# Patient Record
Sex: Female | Born: 1995 | Hispanic: No | Marital: Single | Smoking: Never smoker
Health system: Southern US, Community
[De-identification: ages and names within clinical notes are randomized; demographics above are authoritative.]

## PROBLEM LIST (undated history)

## (undated) ENCOUNTER — Emergency Department (HOSPITAL_COMMUNITY): Admission: EM | Payer: BLUE CROSS/BLUE SHIELD | Source: Home / Self Care

## (undated) DIAGNOSIS — F32A Depression, unspecified: Secondary | ICD-10-CM

## (undated) DIAGNOSIS — S62609A Fracture of unspecified phalanx of unspecified finger, initial encounter for closed fracture: Secondary | ICD-10-CM

## (undated) DIAGNOSIS — R51 Headache: Secondary | ICD-10-CM

## (undated) DIAGNOSIS — F419 Anxiety disorder, unspecified: Secondary | ICD-10-CM

## (undated) DIAGNOSIS — F429 Obsessive-compulsive disorder, unspecified: Secondary | ICD-10-CM

## (undated) DIAGNOSIS — F4310 Post-traumatic stress disorder, unspecified: Secondary | ICD-10-CM

## (undated) HISTORY — DX: Post-traumatic stress disorder, unspecified: F43.10

## (undated) HISTORY — DX: Anxiety disorder, unspecified: F41.9

## (undated) HISTORY — DX: Obsessive-compulsive disorder, unspecified: F42.9

## (undated) HISTORY — PX: FINGER FRACTURE SURGERY: SHX638

## (undated) HISTORY — DX: Headache: R51

## (undated) HISTORY — DX: Depression, unspecified: F32.A

---
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
## 2017-01-19 ENCOUNTER — Encounter: Payer: Self-pay | Admitting: Women's Health

## 2017-01-19 ENCOUNTER — Ambulatory Visit (INDEPENDENT_AMBULATORY_CARE_PROVIDER_SITE_OTHER): Payer: BLUE CROSS/BLUE SHIELD | Admitting: Women's Health

## 2017-01-19 VITALS — BP 124/80 | Ht 65.0 in | Wt 116.0 lb

## 2017-01-19 DIAGNOSIS — Z01419 Encounter for gynecological examination (general) (routine) without abnormal findings: Secondary | ICD-10-CM

## 2017-01-19 DIAGNOSIS — Z30011 Encounter for initial prescription of contraceptive pills: Secondary | ICD-10-CM | POA: Diagnosis not present

## 2017-01-19 LAB — CBC WITH DIFFERENTIAL/PLATELET
BASOS ABS: 49 {cells}/uL (ref 0–200)
BASOS PCT: 0.6 %
EOS PCT: 1.2 %
Eosinophils Absolute: 97 cells/uL (ref 15–500)
HEMATOCRIT: 36.7 % (ref 35.0–45.0)
HEMOGLOBIN: 12.1 g/dL (ref 11.7–15.5)
LYMPHS ABS: 2867 {cells}/uL (ref 850–3900)
MCH: 27.8 pg (ref 27.0–33.0)
MCHC: 33 g/dL (ref 32.0–36.0)
MCV: 84.2 fL (ref 80.0–100.0)
MPV: 9 fL (ref 7.5–12.5)
Monocytes Relative: 5.3 %
NEUTROS ABS: 4658 {cells}/uL (ref 1500–7800)
Neutrophils Relative %: 57.5 %
Platelets: 343 10*3/uL (ref 140–400)
RBC: 4.36 10*6/uL (ref 3.80–5.10)
RDW: 11.8 % (ref 11.0–15.0)
Total Lymphocyte: 35.4 %
WBC mixed population: 429 cells/uL (ref 200–950)
WBC: 8.1 10*3/uL (ref 3.8–10.8)

## 2017-01-19 MED ORDER — YAZ 3-0.02 MG PO TABS: 1.0000 | ORAL_TABLET | Freq: Every day | ORAL | 4 refills | Status: DC

## 2017-01-19 NOTE — Patient Instructions (Signed)

## 2017-01-19 NOTE — Progress Notes (Signed)
Pamela Guerra 07/28/1995 [id number]    History:    Presents for annual exam.  Monthly cycles on generic Yaz, pharmacy changed to different Yaz generic which she has taken for the past month, noticed increased acne outbreaks. Same partner, negative STD screen. Completed Gardasil series. History of migraines which rarely occur about once every 1-2 months, does not require medication.  Past medical history, past surgical history, family history and social history were all reviewed and documented in the EPIC chart. Lives at home, [employer], psychology major, interested in behavior analysis & autism.   ROS:  A ROS was performed and pertinent positives and negatives are included.  Exam:  Vitals:   01/19/17 1417  BP: 124/80  Weight: 116 lb (52.6 kg)  Height: 5\' 5"  (1.651 m)   Body mass index is 19.3 kg/m.   General appearance:  Normal Thyroid:  Symmetrical, normal in size, without palpable masses or nodularity. Respiratory  Auscultation:  Clear without wheezing or rhonchi Cardiovascular  Auscultation:  Regular rate, without rubs, murmurs or gallops  Edema/varicosities:  Not grossly evident Abdominal  Soft,nontender, without masses, guarding or rebound.  Liver/spleen:  No organomegaly noted  Hernia:  None appreciated  Skin  Inspection:  Grossly normal   Breasts: Examined lying and sitting.     Right: Without masses, retractions, discharge or axillary adenopathy.     Left: Without masses, retractions, discharge or axillary adenopathy. Gentitourinary   Inguinal/mons:  Normal without inguinal adenopathy  External genitalia:  Normal  BUS/Urethra/Skene's glands:  Normal  Vagina:  Normal  Cervix:  Normal  Uterus:  antverted, normal in size, shape and contour.  Midline and mobile  Adnexa/parametria:     Rt: Without masses or tenderness.   Lt: Without masses or tenderness.  Anus and perineum: Normal   Assessment/Plan:  21 y.o.SWF G0 presents for annual exam with increasing  acne with different generic Yaz.  Monthly cycles on  Yaz Acne Migraines-rare  Plan: Yaz prescription sent to pharmacy to dispense as written. Reviewed proper use and slight risk for blood clots and stroke. Instructed to call if continued problems. Safe dating, driving, and campus safety reviewed. Encouraged SBE's, exercise, calcium rich diet, and multivitamin. CBC.   Harrington Challengerancy J Samael Blades [facility] State HospitalWHNP, 2:48 PM 01/19/2017

## 2017-02-01 ENCOUNTER — Ambulatory Visit (HOSPITAL_COMMUNITY)
Admission: EM | Admit: 2017-02-01 | Discharge: 2017-02-01 | Disposition: A | Discharge status: Home / Self Care | Payer: BLUE CROSS/BLUE SHIELD | Attending: Family Medicine | Admitting: Family Medicine

## 2017-02-01 ENCOUNTER — Encounter (HOSPITAL_COMMUNITY): Payer: Self-pay | Admitting: *Deleted

## 2017-02-01 ENCOUNTER — Other Ambulatory Visit: Payer: Self-pay

## 2017-02-01 DIAGNOSIS — R300 Dysuria: Secondary | ICD-10-CM | POA: Diagnosis not present

## 2017-02-01 DIAGNOSIS — M545 Low back pain: Secondary | ICD-10-CM | POA: Insufficient documentation

## 2017-02-01 DIAGNOSIS — N390 Urinary tract infection, site not specified: Secondary | ICD-10-CM | POA: Diagnosis not present

## 2017-02-01 LAB — POCT URINALYSIS DIP (DEVICE)
BILIRUBIN URINE: NEGATIVE
GLUCOSE, UA: 100 mg/dL — AB
Ketones, ur: NEGATIVE mg/dL
NITRITE: POSITIVE — AB
Protein, ur: NEGATIVE mg/dL
UROBILINOGEN UA: 1 mg/dL (ref 0.0–1.0)
pH: 6 (ref 5.0–8.0)

## 2017-02-01 MED ORDER — FLUORESCEIN SODIUM 0.6 MG OP STRP
ORAL_STRIP | OPHTHALMIC | Status: AC
  Filled 2017-02-01: qty 1

## 2017-02-01 MED ORDER — TETRACAINE HCL 0.5 % OP SOLN
OPHTHALMIC | Status: AC
  Filled 2017-02-01: qty 4

## 2017-02-01 MED ORDER — CEPHALEXIN 500 MG PO CAPS: 500.0000 mg | ORAL_CAPSULE | Freq: Four times a day (QID) | ORAL | 0 refills | Status: DC

## 2017-02-01 NOTE — ED Provider Notes (Signed)
[facility]   [id number] 02/01/17 Arrival Time: 1116   SUBJECTIVE:  Pamela Guerra is a 21 y.o. female who presents to the urgent care with 2 day history of burning urination, sensation like she has to urinate, pain when urinating, today she started having lower back pain,    Past Medical History:  Diagnosis Date  . Headache(784.0)   . Phalanx (hand) fracture 01/2011   right small middle phalanx fx.   Family History  Problem Relation Age of Onset  . Cancer Paternal Grandfather        esophagus  . Stroke Maternal Grandfather    Social History   Socioeconomic History  . Marital status: Single    Spouse name: Not on file  . Number of children: Not on file  . Years of education: Not on file  . Highest education level: Not on file  Social Needs  . Financial resource strain: Not on file  . Food insecurity - worry: Not on file  . Food insecurity - inability: Not on file  . Transportation needs - medical: Not on file  . Transportation needs - non-medical: Not on file  Occupational History  . Not on file  Tobacco Use  . Smoking status: Never Smoker  . Smokeless tobacco: Never Used  Substance and Sexual Activity  . Alcohol use: No  . Drug use: No  . Sexual activity: Yes    Birth control/protection: Condom  Other Topics Concern  . Not on file  Social History Narrative  . Not on file   Current Meds  Medication Sig  . YAZ 3-0.02 MG tablet Take 1 tablet by mouth daily.   Allergies  Allergen Reactions  . Other     Per pt she do not remember the name of the med she was prescribed for UTI.       ROS: As per HPI, remainder of ROS negative.   OBJECTIVE:   Vitals:   02/01/17 1148  BP: 113/74  Pulse: 92  Temp: 98.2 F (36.8 C)  TempSrc: Oral  SpO2: 98%     General appearance: alert; no distress Eyes: PERRL; EOMI; conjunctiva normal HENT: normocephalic; atraumatic; TMs normal, canal normal, external ears normal without trauma; nasal mucosa  normal; oral mucosa normal Neck: supple Abdomen: soft, non-tender; bowel sounds normal; no masses or organomegaly; no guarding or rebound tenderness Back: no CVA tenderness Extremities: no cyanosis or edema; symmetrical with no gross deformities Skin: warm and dry Neurologic: normal gait; grossly normal Psychological: alert and cooperative; normal mood and affect      Labs:  Results for orders placed or performed during the hospital encounter of 02/01/17  POCT urinalysis dip (device)  Result Value Ref Range   Glucose, UA 100 (A) NEGATIVE mg/dL   Bilirubin Urine NEGATIVE NEGATIVE   Ketones, ur NEGATIVE NEGATIVE mg/dL   Specific Gravity, Urine <=1.005 1.005 - 1.030   Hgb urine dipstick TRACE (A) NEGATIVE   pH 6.0 5.0 - 8.0   Protein, ur NEGATIVE NEGATIVE mg/dL   Urobilinogen, UA 1.0 0.0 - 1.0 mg/dL   Nitrite POSITIVE (A) NEGATIVE   Leukocytes, UA LARGE (A) NEGATIVE    Labs Reviewed  POCT URINALYSIS DIP (DEVICE) - Abnormal; Notable for the following components:      Result Value   Glucose, UA 100 (*)    Hgb urine dipstick TRACE (*)    Nitrite POSITIVE (*)    Leukocytes, UA LARGE (*)    All other components within normal limits  No results found.     ASSESSMENT & PLAN:  No diagnosis found.  No orders of the defined types were placed in this encounter.   Reviewed expectations re: course of current medical issues. Questions answered. Outlined signs and symptoms indicating need for more acute intervention. Patient verbalized understanding. After Visit Summary given.    Procedures:      Elvina SidleLauenstein, Josanna Hefel, MD 02/01/17 1231

## 2017-02-01 NOTE — ED Triage Notes (Signed)
Per pt she things she have UTI, per pt she has been taking Azo but it did not help her, per pt burning urination, sensation like she has to urinate, pain when urinating, today she started having lower back pain,

## 2017-02-01 NOTE — Discharge Instructions (Signed)
The urine test done here strongly suggest that you do have a urinary infection which should resolve with the antibiotic prescribed.

## 2017-02-02 LAB — URINE CULTURE: Culture: <10000 — AB

## 2017-10-05 ENCOUNTER — Ambulatory Visit (INDEPENDENT_AMBULATORY_CARE_PROVIDER_SITE_OTHER): Payer: BLUE CROSS/BLUE SHIELD | Admitting: Women's Health

## 2017-10-05 ENCOUNTER — Encounter: Payer: Self-pay | Admitting: Women's Health

## 2017-10-05 VITALS — BP 116/72 | Ht 65.0 in | Wt 113.0 lb

## 2017-10-05 DIAGNOSIS — R634 Abnormal weight loss: Secondary | ICD-10-CM | POA: Diagnosis not present

## 2017-10-05 NOTE — Patient Instructions (Signed)
 High-Protein and High-Calorie Diet Eating high-protein and high-calorie foods can help you to gain weight, heal after an injury, and recover after an illness or surgery. What is my plan? The specific amount of daily protein and calories you need depends on:  Your body weight.  The reason this diet is recommended for you.  Generally, a high-protein, high-calorie diet involves:  Eating 250-500 extra calories each day.  Making sure that 10-35% of your daily calories come from protein.  Talk to your health care provider about how much protein and how many calories you need each day. Follow the diet as directed by your health care provider. What do I need to know about this diet?  Ask your health care provider if you should take a nutritional supplement.  Try to eat six small meals each day instead of three large meals.  Eat a balanced diet, including one food that is high in protein at each meal.  Keep nutritious snacks handy, such as nuts, trail mixes, dried fruit, and yogurt.  If you have kidney disease or diabetes, eating too much protein may put extra stress on your kidneys. Talk to your health care provider if you have either of those conditions. What are some high-protein foods? Grains Quinoa. Bulgur wheat. Vegetables Soybeans. Peas. Meats and Other Protein Sources Beef, pork, and poultry. Fish and seafood. Eggs. Tofu. Textured vegetable protein (TVP). Peanut butter. Nuts and seeds. Dried beans. Protein powders. Dairy Whole milk. Whole-milk yogurt. Powdered milk. Cheese. Cottage Cheese. Eggnog. Beverages High-protein supplement drinks. Soy milk. Other Protein bars. The items listed above may not be a complete list of recommended foods or beverages. Contact your dietitian for more options. What are some high-calorie foods? Grains Pasta. Quick breads. Muffins. Pancakes. Ready-to-eat cereal. Vegetables Vegetables cooked in oil or butter. Fried potatoes. Fruits Dried  fruit. Fruit leather. Canned fruit in syrup. Fruit juice. Avocados. Meats and Other Protein Sources Peanut butter. Nuts and seeds. Dairy Heavy cream. Whipped cream. Cream cheese. Sour cream. Ice cream. Custard. Pudding. Beverages Meal-replacement beverages. Nutrition shakes. Fruit juice. Sugar-sweetened soft drinks. Condiments Salad dressing. Mayonnaise. Alfredo sauce. Fruit preserves or jelly. Honey. Syrup. Sweets/Desserts Cake. Cookies. Pie. Pastries. Candy bars. Chocolate. Fats and Oils Butter or margarine. Oil. Gravy. Other Meal-replacement bars. The items listed above may not be a complete list of recommended foods or beverages. Contact your dietitian for more options. What are some tips for including high-protein and high-calorie foods in my diet?  Add whole milk, half-and-half, or heavy cream to cereal, pudding, soup, or hot cocoa.  Add whole milk to instant breakfast drinks.  Add peanut butter to oatmeal or smoothies.  Add powdered milk to baked goods, smoothies, or milkshakes.  Add powdered milk, cream, or butter to mashed potatoes.  Add cheese to cooked vegetables.  Make whole-milk yogurt parfaits. Top them with granola, fruit, or nuts.  Add cottage cheese to your fruit.  Add avocados, cheese, or both to sandwiches or salads.  Add meat, poultry, or seafood to rice, pasta, casseroles, salads, and soups.  Use mayonnaise when making egg salad, chicken salad, or tuna salad.  Use peanut butter as a topping for pretzels, celery, or crackers.  Add beans to casseroles, dips, and spreads.  Add pureed beans to sauces and soups.  Replace calorie-free drinks with calorie-containing drinks, such as milk and fruit juice. This information is not intended to replace advice given to you by your health care provider. Make sure you discuss any questions you have with your   health care provider. Document Released: 02/09/2005 Document Revised: 07/18/2015 Document Reviewed:  07/25/2013 Elsevier Interactive Patient Education  2018 Elsevier Inc.  

## 2017-10-05 NOTE — Progress Notes (Signed)
22 year old S[employer] G0 presents with complaint of inability to gain weight.  States she is too thin and although eating close to 2500 cal daily has been unable to gain weight.  States her parents are slightly overweight, looks like 1 of her aunts and has the same thin body build.  With review of diet - diet is high in proteins and low in carbohydrates, exercises several times weekly for 45 minutes to an hour.  Reports parents eat rice numerous times daily and she rarely eats rice.  Currently not in a relationship but has had a past boyfriend tell her she was too thin.  Weight today 113 BMI of 18.8.  Monthly cycle on Yaz, not sexually active, denies need for STD screen.  Denies feeling depressed but did become slightly tearful when asked.  Denies vaginal discharge, urinary symptoms, abdominal pain or fever.  Student [employer] doing well.  Exam: Slim but appears well.  Perceived low weight  Plan: Will check TSH and CMP.  Reviewed most likely genetic body build, continue to eat healthy foods , add in more carbohydrates to diet. Encouraged to talk to a counselor at student [facility] at [employer].  Reassured she was normal.

## 2017-10-06 LAB — COMPREHENSIVE METABOLIC PANEL
AG RATIO: 1.2 (calc) (ref 1.0–2.5)
ALT: 6 U/L (ref 6–29)
AST: 10 U/L (ref 10–30)
Albumin: 4 g/dL (ref 3.6–5.1)
Alkaline phosphatase (APISO): 49 U/L (ref 33–115)
BUN: 10 mg/dL (ref 7–25)
CO2: 26 mmol/L (ref 20–32)
Calcium: 9.5 mg/dL (ref 8.6–10.2)
Chloride: 102 mmol/L (ref 98–110)
Creat: 0.72 mg/dL (ref 0.50–1.10)
GLUCOSE: 97 mg/dL (ref 65–99)
Globulin: 3.3 g/dL (calc) (ref 1.9–3.7)
Potassium: 3.5 mmol/L (ref 3.5–5.3)
SODIUM: 136 mmol/L (ref 135–146)
TOTAL PROTEIN: 7.3 g/dL (ref 6.1–8.1)
Total Bilirubin: 0.3 mg/dL (ref 0.2–1.2)

## 2017-10-06 LAB — TSH: TSH: 0.65 mIU/L

## 2018-01-30 ENCOUNTER — Other Ambulatory Visit: Payer: Self-pay | Admitting: Women's Health

## 2018-01-30 DIAGNOSIS — Z30011 Encounter for initial prescription of contraceptive pills: Secondary | ICD-10-CM

## 2018-01-31 NOTE — Telephone Encounter (Signed)
annual exam on 02/01/18

## 2018-02-01 ENCOUNTER — Ambulatory Visit (INDEPENDENT_AMBULATORY_CARE_PROVIDER_SITE_OTHER): Payer: BLUE CROSS/BLUE SHIELD | Admitting: Women's Health

## 2018-02-01 ENCOUNTER — Encounter: Payer: Self-pay | Admitting: Women's Health

## 2018-02-01 VITALS — BP 110/82 | Ht 65.5 in | Wt 115.8 lb

## 2018-02-01 DIAGNOSIS — Z01419 Encounter for gynecological examination (general) (routine) without abnormal findings: Secondary | ICD-10-CM

## 2018-02-01 DIAGNOSIS — Z30011 Encounter for initial prescription of contraceptive pills: Secondary | ICD-10-CM

## 2018-02-01 LAB — CBC WITH DIFFERENTIAL/PLATELET
BASOS ABS: 59 {cells}/uL (ref 0–200)
BASOS PCT: 0.8 %
EOS ABS: 148 {cells}/uL (ref 15–500)
EOS PCT: 2 %
HEMATOCRIT: 37.5 % (ref 35.0–45.0)
Hemoglobin: 12.4 g/dL (ref 11.7–15.5)
LYMPHS ABS: 2597 {cells}/uL (ref 850–3900)
MCH: 27.6 pg (ref 27.0–33.0)
MCHC: 33.1 g/dL (ref 32.0–36.0)
MCV: 83.5 fL (ref 80.0–100.0)
MONOS PCT: 8.9 %
MPV: 9.2 fL (ref 7.5–12.5)
NEUTROS ABS: 3937 {cells}/uL (ref 1500–7800)
NEUTROS PCT: 53.2 %
Platelets: 429 10*3/uL — ABNORMAL HIGH (ref 140–400)
RBC: 4.49 10*6/uL (ref 3.80–5.10)
RDW: 11.9 % (ref 11.0–15.0)
Total Lymphocyte: 35.1 %
WBC mixed population: 659 cells/uL (ref 200–950)
WBC: 7.4 10*3/uL (ref 3.8–10.8)

## 2018-02-01 MED ORDER — YAZ 3-0.02 MG PO TABS: 1.0000 | ORAL_TABLET | Freq: Every day | ORAL | 4 refills | Status: DC

## 2018-02-01 NOTE — Progress Notes (Signed)
Pamela Guerra 05/01/1995 [id number]    History:    Presents for annual exam.  Light monthly cycle on Yaz without complaint.  History of migraines without aura.  Gardasil series completed.  Recent break-up with same partner with negative STD screen.  Denies need for STD screen.  Past medical history, past surgical history, family history and social history were all reviewed and documented in the EPIC chart.  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  Parents healthy.  Originally from [place].  ROS:  A ROS was performed and pertinent positives and negatives are included.  Exam:  Vitals:   02/01/18 1422  BP: 110/82  Weight: 115 lb 12.8 oz (52.5 kg)  Height: 5' 5.5" (1.664 m)   Body mass index is 18.98 kg/m.   General appearance:  Normal Thyroid:  Symmetrical, normal in size, without palpable masses or nodularity. Respiratory  Auscultation:  Clear without wheezing or rhonchi Cardiovascular  Auscultation:  Regular rate, without rubs, murmurs or gallops  Edema/varicosities:  Not grossly evident Abdominal  Soft,nontender, without masses, guarding or rebound.  Liver/spleen:  No organomegaly noted  Hernia:  None appreciated  Skin  Inspection:  Grossly normal   Breasts: Examined lying and sitting.     Right: Without masses, retractions, discharge or axillary adenopathy.     Left: Without masses, retractions, discharge or axillary adenopathy. Gentitourinary   Inguinal/mons:  Normal without inguinal adenopathy  External genitalia:  Normal  BUS/Urethra/Skene's glands:  Normal  Vagina:  Normal  Cervix:  Normal  Uterus:   normal in size, shape and contour.  Midline and mobile  Adnexa/parametria:     Rt: Without masses or tenderness.   Lt: Without masses or tenderness.  Anus and perineum: Normal    Assessment/Plan:  22 y.o. S [employer] G0 for annual exam.  No complaints.  Monthly cycle on Yaz  Plan: Yaz prescription, proper use, slight risk for blood clots and strokes reviewed.   SBEs, exercise, calcium rich foods, MVI daily encouraged.  Campus safety reviewed.  CBC.    Harrington Challengerancy J Haadiya Frogge [facility]NP, 3:34 PM 02/01/2018

## 2018-02-01 NOTE — Patient Instructions (Signed)

## 2018-02-09 ENCOUNTER — Telehealth: Payer: Self-pay | Admitting: *Deleted

## 2018-02-09 NOTE — Telephone Encounter (Signed)
Prior authorization done via phone with [facility] for brand Yaz, information now passed to review, will wait for response. Patient aware of this as well.

## 2018-02-21 NOTE — Telephone Encounter (Signed)
Abby PotashBrand Yaz approved until 02/2019 [facility] and patient informed.

## 2018-03-01 ENCOUNTER — Telehealth: Payer: Self-pay | Admitting: *Deleted

## 2018-03-01 MED ORDER — DROSPIRENONE-ETHINYL ESTRADIOL 3-0.02 MG PO TABS: 1.0000 | ORAL_TABLET | Freq: Every day | ORAL | 3 refills | Status: DC

## 2018-03-01 NOTE — Telephone Encounter (Signed)
Patient called and left message asking for generic yaz to be sent to pharmacy, patient said she can no longer afford the brand. Rx sent.

## 2018-06-22 ENCOUNTER — Other Ambulatory Visit: Payer: Self-pay | Admitting: Women's Health

## 2018-10-14 ENCOUNTER — Encounter: Payer: Self-pay | Admitting: Gynecology

## 2018-10-14 ENCOUNTER — Ambulatory Visit: Payer: BC Managed Care – PPO | Admitting: Gynecology

## 2018-10-14 ENCOUNTER — Other Ambulatory Visit: Payer: Self-pay

## 2018-10-14 VITALS — BP 118/74

## 2018-10-14 DIAGNOSIS — R300 Dysuria: Secondary | ICD-10-CM | POA: Diagnosis not present

## 2018-10-14 DIAGNOSIS — N3000 Acute cystitis without hematuria: Secondary | ICD-10-CM | POA: Diagnosis not present

## 2018-10-14 MED ORDER — NITROFURANTOIN MONOHYD MACRO 100 MG PO CAPS: ORAL_CAPSULE | ORAL | 0 refills | Status: DC

## 2018-10-14 MED ORDER — SULFAMETHOXAZOLE-TRIMETHOPRIM 800-160 MG PO TABS: 1.0000 | ORAL_TABLET | Freq: Two times a day (BID) | ORAL | 0 refills | Status: DC

## 2018-10-14 NOTE — Progress Notes (Signed)
    Pamela Guerra 08-29-1995 [id number]        23 y.o.  G0P0 presents complaining of 3 days of urinary frequency and dysuria.  There was some mild low back pain.  No fever or chills.  No vaginal discharge irritation or odor.  Notes that she is having fairly frequent UTIs that she is treating with AZO and pushing fluids.  Notes they occur after intercourse.  Past medical history,surgical history, problem list, medications, allergies, family history and social history were all reviewed and documented in the EPIC chart.  Directed ROS with pertinent positives and negatives documented in the history of present illness/assessment and plan.  Exam: Caryn Bee assistant Vitals:   10/14/18 0949  BP: 118/74   General appearance:  Normal Spine straight without CVA tenderness Abdomen soft nontender without masses guarding rebound Pelvic external BUS vagina normal.  Cervix normal.  Uterus normal size midline mobile nontender.  Adnexa without masses or tenderness.  Assessment/Plan:  23 y.o. G0P0 with symptoms and urine analysis consistent with UTI.  Will treat with Septra DS 1 p.o. twice daily x3 days.  Discussed recurrences after intercourse.  Options for management reviewed.  Ultimately will try Macrobid 100 mg 1 tab with intercourse #30 prescribed.  We will see how she does with this.  She will follow-up if she continues to have issues and discussed possible referral to urology.    Anastasio Auerbach MD, 11:04 AM 10/14/2018

## 2018-10-14 NOTE — Patient Instructions (Signed)
Take the Septra antibiotic twice daily for 3 days to treat the urinary tract infection.  Take the Macrodantin antibiotic 1 pill with intercourse to hopefully prevent recurrent urinary tract infections.  Follow-up if you continue to have issues with urinary tract infections and we may refer you to urology.

## 2018-10-17 ENCOUNTER — Telehealth: Payer: Self-pay | Admitting: *Deleted

## 2018-10-17 ENCOUNTER — Other Ambulatory Visit: Payer: Self-pay

## 2018-10-17 DIAGNOSIS — Z20822 Contact with and (suspected) exposure to covid-19: Secondary | ICD-10-CM

## 2018-10-17 LAB — URINALYSIS, COMPLETE W/RFL CULTURE
Hyaline Cast: NONE SEEN /LPF
WBC, UA: >=60 /HPF — AB (ref 0–5)

## 2018-10-17 LAB — CULTURE INDICATED

## 2018-10-17 LAB — URINE CULTURE
MICRO NUMBER:: 801896
SPECIMEN QUALITY:: ADEQUATE

## 2018-10-17 NOTE — Telephone Encounter (Signed)
Patient was treated for UTI at office visit  on 10/14/18 finished medication and still has urgency, with light burning with urination. Please advise

## 2018-10-17 NOTE — Telephone Encounter (Signed)
Recommend ciprofloxacin 250 mg twice daily x5 days.

## 2018-10-18 ENCOUNTER — Telehealth: Payer: Self-pay | Admitting: *Deleted

## 2018-10-18 ENCOUNTER — Telehealth: Payer: Self-pay | Admitting: General Practice

## 2018-10-18 LAB — NOVEL CORONAVIRUS, NAA: SARS-CoV-2, NAA: NOT DETECTED

## 2018-10-18 MED ORDER — CIPROFLOXACIN HCL 250 MG PO TABS: 250.0000 mg | ORAL_TABLET | Freq: Two times a day (BID) | ORAL | 0 refills | Status: DC

## 2018-10-18 NOTE — Telephone Encounter (Signed)
Patient called and given negative COVID - 19  results, would like to know if she should still quarantine .

## 2018-10-18 NOTE — Telephone Encounter (Signed)
error 

## 2018-10-18 NOTE — Telephone Encounter (Signed)
Patient said she feels better, but would like Rx sent to pharmacy to have incase symptoms should worsen.

## 2018-11-14 ENCOUNTER — Encounter: Payer: Self-pay | Admitting: Gynecology

## 2018-12-03 ENCOUNTER — Other Ambulatory Visit: Payer: Self-pay | Admitting: Women's Health

## 2018-12-23 ENCOUNTER — Telehealth: Payer: Self-pay | Admitting: *Deleted

## 2018-12-23 NOTE — Telephone Encounter (Signed)
Patient called c/o increased anxiety, has not been to school or work x 1 week. Tearful while talking, reports doesn't want to get out of bed some days, she does not have PCP. No suicidal thoughts, I advise patient schedule appointment with Izora Gala to discuss increased anxiety, transferred to appointment desk and patient was not able to schedule because she wants early Monday am appointment. Patient is aware you are out of the office on Thursday and Friday. I just wanted you to know.

## 2018-12-24 NOTE — Telephone Encounter (Signed)
TC, will come Monday am at 8am.

## 2018-12-26 ENCOUNTER — Other Ambulatory Visit: Payer: Self-pay

## 2018-12-26 ENCOUNTER — Ambulatory Visit (INDEPENDENT_AMBULATORY_CARE_PROVIDER_SITE_OTHER): Payer: BC Managed Care – PPO | Admitting: Women's Health

## 2018-12-26 ENCOUNTER — Encounter: Payer: Self-pay | Admitting: Women's Health

## 2018-12-26 VITALS — BP 120/80

## 2018-12-26 DIAGNOSIS — F4323 Adjustment disorder with mixed anxiety and depressed mood: Secondary | ICD-10-CM

## 2018-12-26 NOTE — Progress Notes (Signed)
23 year old [employer] G0 presents with complaint of anxiety, feeling sad, overwhelmed, isolated for the past weeks.  States was not able to go to work last week had difficulty even with self-care.  Denies any suicidal ideation or self-harm.  States sleeping okay.  Reports feelings of anxiety in the past but was able to move the past.  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  Light monthly cycle on Yaz.  Gardasil series completed.  Denies any urinary, vaginal, abdominal symptoms.  Exam: Well-groomed, tearful, planning to go to work today.  Able to articulate feelings.  Situational stress/numerous  life changes  Plan: Long discussion concerning cultural background is from [place], feels pressure from parents to be successful, do well since they have made many sacrifices.  States they are very supportive financially and emotionally, states most of the pressure she puts on herself.  Has a scheduled appointment with psychiatrist November 14 to discuss possible meds.  Encouraged counseling either through school or private counselor.  Reviewed importance of self-care, leisure activities, exercise, outdoor activities.  Encouraged reaching out to friends.  Reviewed many of her feelings are due to the many changes going on in her life.

## 2018-12-26 NOTE — Patient Instructions (Signed)
Living With Depression Everyone experiences occasional disappointment, sadness, and loss in their lives. When you are feeling down, blue, or sad for at least 2 weeks in a row, it may mean that you have depression. Depression can affect your thoughts and feelings, relationships, daily activities, and physical health. It is caused by changes in the way your brain functions. If you receive a diagnosis of depression, your health care provider will tell you which type of depression you have and what treatment options are available to you. If you are living with depression, there are ways to help you recover from it and also ways to prevent it from coming back. How to cope with lifestyle changes Coping with stress     Stress is your body's reaction to life changes and events, both good and bad. Stressful situations may include:  Getting married.  The death of a spouse.  Losing a job.  Retiring.  Having a baby. Stress can last just a few hours or it can be ongoing. Stress can play a major role in depression, so it is important to learn both how to cope with stress and how to think about it differently. Talk with your health care provider or a counselor if you would like to learn more about stress reduction. He or she may suggest some stress reduction techniques, such as:  Music therapy. This can include creating music or listening to music. Choose music that you enjoy and that inspires you.  Mindfulness-based meditation. This kind of meditation can be done while sitting or walking. It involves being aware of your normal breaths, rather than trying to control your breathing.  Centering prayer. This is a kind of meditation that involves focusing on a spiritual word or phrase. Choose a word, phrase, or sacred image that is meaningful to you and that brings you peace.  Deep breathing. To do this, expand your stomach and inhale slowly through your nose. Hold your breath for 3-5 seconds, then exhale  slowly, allowing your stomach muscles to relax.  Muscle relaxation. This involves intentionally tensing muscles then relaxing them. Choose a stress reduction technique that fits your lifestyle and personality. Stress reduction techniques take time and practice to develop. Set aside 5-15 minutes a day to do them. Therapists can offer training in these techniques. The training may be covered by some insurance plans. Other things you can do to manage stress include:  Keeping a stress diary. This can help you learn what triggers your stress and ways to control your response.  Understanding what your limits are and saying no to requests or events that lead to a schedule that is too full.  Thinking about how you respond to certain situations. You may not be able to control everything, but you can control how you react.  Adding humor to your life by watching funny films or TV shows.  Making time for activities that help you relax and not feeling guilty about spending your time this way.  Medicines Your health care provider may suggest certain medicines if he or she feels that they will help improve your condition. Avoid using alcohol and other substances that may prevent your medicines from working properly (may interact). It is also important to:  Talk with your pharmacist or health care provider about all the medicines that you take, their possible side effects, and what medicines are safe to take together.  Make it your goal to take part in all treatment decisions (shared decision-making). This includes giving input on   the side effects of medicines. It is best if shared decision-making with your health care provider is part of your total treatment plan. If your health care provider prescribes a medicine, you may not notice the full benefits of it for 4-8 weeks. Most people who are treated for depression need to be on medicine for at least 6-12 months after they feel better. If you are taking  medicines as part of your treatment, do not stop taking medicines without first talking to your health care provider. You may need to have the medicine slowly decreased (tapered) over time to decrease the risk of harmful side effects. Relationships Your health care provider may suggest family therapy along with individual therapy and drug therapy. While there may not be family problems that are causing you to feel depressed, it is still important to make sure your family learns as much as they can about your mental health. Having your family's support can help make your treatment successful. How to recognize changes in your condition Everyone has a different response to treatment for depression. Recovery from major depression happens when you have not had signs of major depression for two months. This may mean that you will start to:  Have more interest in doing activities.  Feel less hopeless than you did 2 months ago.  Have more energy.  Overeat less often, or have better or improving appetite.  Have better concentration. Your health care provider will work with you to decide the next steps in your recovery. It is also important to recognize when your condition is getting worse. Watch for these signs:  Having fatigue or low energy.  Eating too much or too little.  Sleeping too much or too little.  Feeling restless, agitated, or hopeless.  Having trouble concentrating or making decisions.  Having unexplained physical complaints.  Feeling irritable, angry, or aggressive. Get help as soon as you or your family members notice these symptoms coming back. How to get support and help from others How to talk with friends and family members about your condition  Talking to friends and family members about your condition can provide you with one way to get support and guidance. Reach out to trusted friends or family members, explain your symptoms to them, and let them know that you are  working with a health care provider to treat your depression. Financial resources Not all insurance plans cover mental health care, so it is important to check with your insurance carrier. If paying for co-pays or counseling services is a problem, search for a local or county mental health care center. They may be able to offer public mental health care services at low or no cost when you are not able to see a private health care provider. If you are taking medicine for depression, you may be able to get the generic form, which may be less expensive. Some makers of prescription medicines also offer help to patients who cannot afford the medicines they need. Follow these instructions at home:   Get the right amount and quality of sleep.  Cut down on using caffeine, tobacco, alcohol, and other potentially harmful substances.  Try to exercise, such as walking or lifting small weights.  Take over-the-counter and prescription medicines only as told by your health care provider.  Eat a healthy diet that includes plenty of vegetables, fruits, whole grains, low-fat dairy products, and lean protein. Do not eat a lot of foods that are high in solid fats, added sugars, or salt.    Keep all follow-up visits as told by your health care provider. This is important. Contact a health care provider if:  You stop taking your antidepressant medicines, and you have any of these symptoms: ? Nausea. ? Headache. ? Feeling lightheaded. ? Chills and body aches. ? Not being able to sleep (insomnia).  You or your friends and family think your depression is getting worse. Get help right away if:  You have thoughts of hurting yourself or others. If you ever feel like you may hurt yourself or others, or have thoughts about taking your own life, get help right away. You can go to your nearest emergency department or call:  Your local emergency services (911 in the [place]  A suicide crisis helpline, such as the  [facility] at 770-416-7093. This is open 24-hours a day. Summary  If you are living with depression, there are ways to help you recover from it and also ways to prevent it from coming back.  Work with your health care team to create a management plan that includes counseling, stress management techniques, and healthy lifestyle habits. This information is not intended to replace advice given to you by your health care provider. Make sure you discuss any questions you have with your health care provider. Document Released: 01/13/2016 Document Revised: 06/03/2018 Document Reviewed: 01/13/2016 Elsevier Patient Education  2020 [place]  After being diagnosed with an anxiety disorder, you may be relieved to know why you have felt or behaved a certain way. It is natural to also feel overwhelmed about the treatment ahead and what it will mean for your life. With care and support, you can manage this condition and recover from it. How to cope with anxiety Dealing with stress Stress is your body's reaction to life changes and events, both good and bad. Stress can last just a few hours or it can be ongoing. Stress can play a major role in anxiety, so it is important to learn both how to cope with stress and how to think about it differently. Talk with your health care provider or a counselor to learn more about stress reduction. He or she may suggest some stress reduction techniques, such as:  Music therapy. This can include creating or listening to music that you enjoy and that inspires you.  Mindfulness-based meditation. This involves being aware of your normal breaths, rather than trying to control your breathing. It can be done while sitting or walking.  Centering prayer. This is a kind of meditation that involves focusing on a word, phrase, or sacred image that is meaningful to you and that brings you peace.  Deep breathing. To do this, expand  your stomach and inhale slowly through your nose. Hold your breath for 3-5 seconds. Then exhale slowly, allowing your stomach muscles to relax.  Self-talk. This is a skill where you identify thought patterns that lead to anxiety reactions and correct those thoughts.  Muscle relaxation. This involves tensing muscles then relaxing them. Choose a stress reduction technique that fits your lifestyle and personality. Stress reduction techniques take time and practice. Set aside 5-15 minutes a day to do them. Therapists can offer training in these techniques. The training may be covered by some insurance plans. Other things you can do to manage stress include:  Keeping a stress diary. This can help you learn what triggers your stress and ways to control your response.  Thinking about how you respond to certain situations. You may not be  able to control everything, but you can control your reaction.  Making time for activities that help you relax, and not feeling guilty about spending your time in this way. Therapy combined with coping and stress-reduction skills provides the best chance for successful treatment. Medicines Medicines can help ease symptoms. Medicines for anxiety include:  Anti-anxiety drugs.  Antidepressants.  Beta-blockers. Medicines may be used as the main treatment for anxiety disorder, along with therapy, or if other treatments are not working. Medicines should be prescribed by a health care provider. Relationships Relationships can play a big part in helping you recover. Try to spend more time connecting with trusted friends and family members. Consider going to couples counseling, taking family education classes, or going to family therapy. Therapy can help you and others better understand the condition. How to recognize changes in your condition Everyone has a different response to treatment for anxiety. Recovery from anxiety happens when symptoms decrease and stop interfering  with your daily activities at home or work. This may mean that you will start to:  Have better concentration and focus.  Sleep better.  Be less irritable.  Have more energy.  Have improved memory. It is important to recognize when your condition is getting worse. Contact your health care provider if your symptoms interfere with home or work and you do not feel like your condition is improving. Where to find help and support: You can get help and support from these sources:  Self-help groups.  Online and [employer].  A trusted spiritual leader.  Couples counseling.  Family education classes.  Family therapy. Follow these instructions at home:  Eat a healthy diet that includes plenty of vegetables, fruits, whole grains, low-fat dairy products, and lean protein. Do not eat a lot of foods that are high in solid fats, added sugars, or salt.  Exercise. Most adults should do the following: ? Exercise for at least 150 minutes each week. The exercise should increase your heart rate and make you sweat (moderate-intensity exercise). ? Strengthening exercises at least twice a week.  Cut down on caffeine, tobacco, alcohol, and other potentially harmful substances.  Get the right amount and quality of sleep. Most adults need 7-9 hours of sleep each night.  Make choices that simplify your life.  Take over-the-counter and prescription medicines only as told by your health care provider.  Avoid caffeine, alcohol, and certain over-the-counter cold medicines. These may make you feel worse. Ask your pharmacist which medicines to avoid.  Keep all follow-up visits as told by your health care provider. This is important. Questions to ask your health care provider  Would I benefit from therapy?  How often should I follow up with a health care provider?  How long do I need to take medicine?  Are there any long-term side effects of my medicine?  Are there any alternatives to  taking medicine? Contact a health care provider if:  You have a hard time staying focused or finishing daily tasks.  You spend many hours a day feeling worried about everyday life.  You become exhausted by worry.  You start to have headaches, feel tense, or have nausea.  You urinate more than normal.  You have diarrhea. Get help right away if:  You have a racing heart and shortness of breath.  You have thoughts of hurting yourself or others. If you ever feel like you may hurt yourself or others, or have thoughts about taking your own life, get help right away. You can  go to your nearest emergency department or call:  Your local emergency services (911 in the [place]  A suicide crisis helpline, such as the National Suicide Prevention Lifeline at 201-804-12251-661-215-0140. This is open 24-hours a day. Summary  Taking steps to deal with stress can help calm you.  Medicines cannot cure anxiety disorders, but they can help ease symptoms.  Family, friends, and partners can play a big part in helping you recover from an anxiety disorder. This information is not intended to replace advice given to you by your health care provider. Make sure you discuss any questions you have with your health care provider. Document Released: 02/04/2016 Document Revised: 01/22/2017 Document Reviewed: 02/04/2016 Elsevier Patient Education  2020 [employer]

## 2019-02-15 ENCOUNTER — Other Ambulatory Visit: Payer: Self-pay

## 2019-02-20 ENCOUNTER — Encounter: Payer: BC Managed Care – PPO | Admitting: Women's Health

## 2019-02-20 DIAGNOSIS — Z0289 Encounter for other administrative examinations: Secondary | ICD-10-CM

## 2019-03-06 ENCOUNTER — Telehealth: Payer: Self-pay | Admitting: *Deleted

## 2019-03-06 MED ORDER — DROSPIRENONE-ETHINYL ESTRADIOL 3-0.02 MG PO TABS: 1.0000 | ORAL_TABLET | Freq: Every day | ORAL | 0 refills | Status: DC

## 2019-03-06 NOTE — Telephone Encounter (Signed)
Patient called requesting refill on Loryn 3-0.02 mg tablet, has annual exam scheduled on 03/21/19. Needs 1 pack, Rx sent.

## 2019-03-21 ENCOUNTER — Encounter: Payer: BC Managed Care – PPO | Admitting: Women's Health

## 2019-03-21 DIAGNOSIS — Z0289 Encounter for other administrative examinations: Secondary | ICD-10-CM

## 2019-03-27 ENCOUNTER — Other Ambulatory Visit: Payer: Self-pay | Admitting: *Deleted

## 2019-04-03 ENCOUNTER — Other Ambulatory Visit: Payer: Self-pay | Admitting: Women's Health

## 2019-05-29 ENCOUNTER — Encounter: Payer: BC Managed Care – PPO | Admitting: Women's Health

## 2019-05-29 DIAGNOSIS — Z0289 Encounter for other administrative examinations: Secondary | ICD-10-CM

## 2019-05-31 ENCOUNTER — Other Ambulatory Visit: Payer: Self-pay

## 2019-05-31 NOTE — Telephone Encounter (Signed)
Patient has scheduled CE x 3 and no show x 3 since January.

## 2019-06-09 ENCOUNTER — Telehealth: Payer: Self-pay | Admitting: *Deleted

## 2019-06-09 MED ORDER — DROSPIRENONE-ETHINYL ESTRADIOL 3-0.02 MG PO TABS: 1.0000 | ORAL_TABLET | Freq: Every day | ORAL | 0 refills | Status: DC

## 2019-06-09 NOTE — Telephone Encounter (Signed)
Patient called now has annual exam scheduled on 07/10/19 requesting refill on birth control pills. Rx sent

## 2019-07-06 ENCOUNTER — Telehealth: Payer: Self-pay | Admitting: *Deleted

## 2019-07-06 MED ORDER — DROSPIRENONE-ETHINYL ESTRADIOL 3-0.02 MG PO TABS: 1.0000 | ORAL_TABLET | Freq: Every day | ORAL | 0 refills | Status: DC

## 2019-07-06 NOTE — Telephone Encounter (Signed)
Patient scheduled on 07/10/19, needs refill on birth control pills.

## 2019-07-07 ENCOUNTER — Other Ambulatory Visit: Payer: Self-pay

## 2019-07-10 ENCOUNTER — Encounter: Payer: BC Managed Care – PPO | Admitting: Nurse Practitioner

## 2019-07-10 DIAGNOSIS — Z0289 Encounter for other administrative examinations: Secondary | ICD-10-CM

## 2020-06-27 ENCOUNTER — Telehealth: Payer: Self-pay | Admitting: *Deleted

## 2020-06-27 MED ORDER — DROSPIRENONE-ETHINYL ESTRADIOL 3-0.02 MG PO TABS: 1.0000 | ORAL_TABLET | Freq: Every day | ORAL | 0 refills | Status: DC

## 2020-06-27 NOTE — Telephone Encounter (Signed)
patient aware, Rx sent.  

## 2020-06-27 NOTE — Telephone Encounter (Signed)
Patient called requesting 1 pack of birth control pills, annual exam scheduled on 07/11/20. It appears patient has scheduled and "no showed" for annual exam x 4.  Last annual exam was in 2019, she reports she saw another GYN last year, but didn't like that practice.  Please advise

## 2020-06-27 NOTE — Telephone Encounter (Signed)
Please send in 1 month supply. Thank you.

## 2020-07-10 ENCOUNTER — Encounter: Payer: Self-pay | Admitting: Nurse Practitioner

## 2020-07-11 ENCOUNTER — Ambulatory Visit (INDEPENDENT_AMBULATORY_CARE_PROVIDER_SITE_OTHER): Payer: BC Managed Care – PPO | Admitting: Nurse Practitioner

## 2020-07-11 ENCOUNTER — Other Ambulatory Visit (HOSPITAL_COMMUNITY)
Admission: RE | Admit: 2020-07-11 | Discharge: 2020-07-11 | Disposition: A | Discharge status: Home / Self Care | Payer: BC Managed Care – PPO | Source: Ambulatory Visit | Attending: Nurse Practitioner | Admitting: Nurse Practitioner

## 2020-07-11 ENCOUNTER — Encounter: Payer: Self-pay | Admitting: Nurse Practitioner

## 2020-07-11 ENCOUNTER — Other Ambulatory Visit: Payer: Self-pay

## 2020-07-11 VITALS — BP 114/66 | Ht 66.0 in | Wt 118.0 lb

## 2020-07-11 DIAGNOSIS — Z113 Encounter for screening for infections with a predominantly sexual mode of transmission: Secondary | ICD-10-CM | POA: Diagnosis present

## 2020-07-11 DIAGNOSIS — Z01419 Encounter for gynecological examination (general) (routine) without abnormal findings: Secondary | ICD-10-CM | POA: Diagnosis not present

## 2020-07-11 DIAGNOSIS — Z3041 Encounter for surveillance of contraceptive pills: Secondary | ICD-10-CM

## 2020-07-11 MED ORDER — DROSPIRENONE-ETHINYL ESTRADIOL 3-0.02 MG PO TABS: 1.0000 | ORAL_TABLET | Freq: Every day | ORAL | 3 refills | Status: DC

## 2020-07-11 NOTE — Progress Notes (Signed)
   Pamela Guerra 07/27/1995 [id number]   History:  25 y.o. G0 presents for annual exam without GYN complaints. Monthly cycle, OCPs. Has received Gardasil. Sexually active. History of migraines without aura but has not been having them as often. PTSD, anxiety and depression managed by psychiatry - on Zoloft and seeing therapy weekly.   Gynecologic History Patient's last menstrual period was 06/30/2020. Period Cycle (Days): 28 Period Duration (Days): 4 Period Pattern: Regular Menstrual Flow: Moderate Dysmenorrhea: (!) Mild Dysmenorrhea Symptoms: Cramping Contraception/Family planning: OCP (estrogen/progesterone)  Health Maintenance Last Pap: Never Last mammogram: Not indicated Last colonoscopy: Not indicated Last Dexa: Not indicated  Past medical history, past surgical history, family history and social history were all reviewed and documented in the EPIC chart. 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  ROS:  A ROS was performed and pertinent positives and negatives are included.  Exam:  Vitals:   07/11/21 0858  BP: 114/66  Weight: 118 lb (53.5 kg)  Height: 5\' 6"  (1.676 m)   Body mass index is 19.05 kg/m.  General appearance:  Normal Thyroid:  Symmetrical, normal in size, without palpable masses or nodularity. Respiratory  Auscultation:  Clear without wheezing or rhonchi Cardiovascular  Auscultation:  Regular rate, without rubs, murmurs or gallops  Edema/varicosities:  Not grossly evident Abdominal  Soft,nontender, without masses, guarding or rebound.  Liver/spleen:  No organomegaly noted  Hernia:  None appreciated  Skin  Inspection:  Grossly normal Breasts: Examined lying and sitting.   Right: Without masses, retractions, nipple discharge or axillary adenopathy.   Left: Without masses, retractions, nipple discharge or axillary adenopathy. Genitourinary   Inguinal/mons:  Normal without inguinal adenopathy  External genitalia:   Normal appearing vulva with no masses, tenderness, or lesions  BUS/Urethra/Skene's glands:  Normal  Vagina:  Normal appearing with normal color and discharge, no lesions  Cervix:  Normal appearing without discharge or lesions  Uterus:  Normal in size, shape and contour.  Midline and mobile, nontender  Adnexa/parametria:     Rt: Normal in size, without masses or tenderness.   Lt: Normal in size, without masses or tenderness.  Anus and perineum: Normal  Assessment/Plan:  25 y.o. G0 for annual exam.   Well female exam with routine gynecological exam - Plan: Cytology - PAP( [facility]). Education provided on SBEs, importance of preventative screenings, current guidelines, high calcium diet, regular exercise, safe sex, and multivitamin daily.   Encounter for surveillance of contraceptive pills - Plan: drospirenone-ethinyl estradiol (LORYNA) 3-0.02 MG tablet daily. Taking as prescribed. Refill x 1 year provided.   Screen for STD (sexually transmitted disease) - Plan: Cytology - PAP( [place]) - GC/Chlamydia.   Screening for cervical cancer - Pap today.   Return in 1 year for annual.   03-[birth year]-1996 DNP, 9:17 AM 07/11/2020

## 2020-07-11 NOTE — Patient Instructions (Signed)
Health Maintenance, Female Adopting a healthy lifestyle and getting preventive care are important in promoting health and wellness. Ask your health care provider about:  The right schedule for you to have regular tests and exams.  Things you can do on your own to prevent diseases and keep yourself healthy. What should I know about diet, weight, and exercise? Eat a healthy diet  Eat a diet that includes plenty of vegetables, fruits, low-fat dairy products, and lean protein.  Do not eat a lot of foods that are high in solid fats, added sugars, or sodium.   Maintain a healthy weight Body mass index (BMI) is used to identify weight problems. It estimates body fat based on height and weight. Your health care provider can help determine your BMI and help you achieve or maintain a healthy weight. Get regular exercise Get regular exercise. This is one of the most important things you can do for your health. Most adults should:  Exercise for at least 150 minutes each week. The exercise should increase your heart rate and make you sweat (moderate-intensity exercise).  Do strengthening exercises at least twice a week. This is in addition to the moderate-intensity exercise.  Spend less time sitting. Even light physical activity can be beneficial. Watch cholesterol and blood lipids Have your blood tested for lipids and cholesterol at 23 years of age, then have this test every 5 years. Have your cholesterol levels checked more often if:  Your lipid or cholesterol levels are high.  You are older than 23 years of age.  You are at high risk for heart disease. What should I know about cancer screening? Depending on your health history and family history, you may need to have cancer screening at various ages. This may include screening for:  Breast cancer.  Cervical cancer.  Colorectal cancer.  Skin cancer.  Lung cancer. What should I know about heart disease, diabetes, and high blood  pressure? Blood pressure and heart disease  High blood pressure causes heart disease and increases the risk of stroke. This is more likely to develop in people who have high blood pressure readings, are of African descent, or are overweight.  Have your blood pressure checked: ? Every 3-5 years if you are 18-39 years of age. ? Every year if you are 40 years old or older. Diabetes Have regular diabetes screenings. This checks your fasting blood sugar level. Have the screening done:  Once every three years after age 40 if you are at a normal weight and have a low risk for diabetes.  More often and at a younger age if you are overweight or have a high risk for diabetes. What should I know about preventing infection? Hepatitis B If you have a higher risk for hepatitis B, you should be screened for this virus. Talk with your health care provider to find out if you are at risk for hepatitis B infection. Hepatitis C Testing is recommended for:  Everyone born from 1945 through 1965.  Anyone with known risk factors for hepatitis C. Sexually transmitted infections (STIs)  Get screened for STIs, including gonorrhea and chlamydia, if: ? You are sexually active and are younger than 24 years of age. ? You are older than 24 years of age and your health care provider tells you that you are at risk for this type of infection. ? Your sexual activity has changed since you were last screened, and you are at increased risk for chlamydia or gonorrhea. Ask your health care provider   if you are at risk.  Ask your health care provider about whether you are at high risk for HIV. Your health care provider may recommend a prescription medicine to help prevent HIV infection. If you choose to take medicine to prevent HIV, you should first get tested for HIV. You should then be tested every 3 months for as long as you are taking the medicine. Pregnancy  If you are about to stop having your period (premenopausal) and  you may become pregnant, seek counseling before you get pregnant.  Take 400 to 800 micrograms (mcg) of folic acid every day if you become pregnant.  Ask for birth control (contraception) if you want to prevent pregnancy. Osteoporosis and menopause Osteoporosis is a disease in which the bones lose minerals and strength with aging. This can result in bone fractures. If you are 65 years old or older, or if you are at risk for osteoporosis and fractures, ask your health care provider if you should:  Be screened for bone loss.  Take a calcium or vitamin D supplement to lower your risk of fractures.  Be given hormone replacement therapy (HRT) to treat symptoms of menopause. Follow these instructions at home: Lifestyle  Do not use any products that contain nicotine or tobacco, such as cigarettes, e-cigarettes, and chewing tobacco. If you need help quitting, ask your health care provider.  Do not use street drugs.  Do not share needles.  Ask your health care provider for help if you need support or information about quitting drugs. Alcohol use  Do not drink alcohol if: ? Your health care provider tells you not to drink. ? You are pregnant, may be pregnant, or are planning to become pregnant.  If you drink alcohol: ? Limit how much you use to 0-1 drink a day. ? Limit intake if you are breastfeeding.  Be aware of how much alcohol is in your drink. In the [place] one drink equals one 12 oz bottle of beer (355 mL), one 5 oz glass of wine (148 mL), or one 1 oz glass of hard liquor (44 mL). General instructions  Schedule regular health, dental, and eye exams.  Stay current with your vaccines.  Tell your health care provider if: ? You often feel depressed. ? You have ever been abused or do not feel safe at home. Summary  Adopting a healthy lifestyle and getting preventive care are important in promoting health and wellness.  Follow your health care provider's instructions about healthy  diet, exercising, and getting tested or screened for diseases.  Follow your health care provider's instructions on monitoring your cholesterol and blood pressure. This information is not intended to replace advice given to you by your health care provider. Make sure you discuss any questions you have with your health care provider. Document Revised: 02/02/2018 Document Reviewed: 02/02/2018 Elsevier Patient Education  2021 [employer] Inc.  

## 2020-07-11 NOTE — Addendum Note (Signed)
Addended byWyline Beady on: 07/11/2020 09:46 AM   Modules accepted: Orders

## 2020-07-12 LAB — CYTOLOGY - PAP
Chlamydia: NEGATIVE
Comment: NEGATIVE
Comment: NORMAL
Diagnosis: NEGATIVE
Neisseria Gonorrhea: NEGATIVE

## 2020-07-15 ENCOUNTER — Other Ambulatory Visit: Payer: Self-pay | Admitting: Nurse Practitioner

## 2020-07-15 DIAGNOSIS — B3731 Acute candidiasis of vulva and vagina: Secondary | ICD-10-CM

## 2020-07-15 DIAGNOSIS — B373 Candidiasis of vulva and vagina: Secondary | ICD-10-CM

## 2020-07-15 MED ORDER — FLUCONAZOLE 150 MG PO TABS: 150.0000 mg | ORAL_TABLET | Freq: Once | ORAL | 0 refills | Status: AC

## 2021-01-01 DIAGNOSIS — Z20822 Contact with and (suspected) exposure to covid-19: Secondary | ICD-10-CM | POA: Diagnosis not present

## 2021-01-26 ENCOUNTER — Other Ambulatory Visit: Payer: Self-pay

## 2021-01-26 ENCOUNTER — Encounter (HOSPITAL_COMMUNITY): Payer: Self-pay | Admitting: Emergency Medicine

## 2021-01-26 ENCOUNTER — Emergency Department (HOSPITAL_COMMUNITY): Payer: BC Managed Care – PPO

## 2021-01-26 ENCOUNTER — Emergency Department (HOSPITAL_COMMUNITY)
Admission: EM | Admit: 2021-01-26 | Discharge: 2021-01-26 | Disposition: A | Discharge status: Home / Self Care | Payer: BC Managed Care – PPO | Attending: Emergency Medicine | Admitting: Emergency Medicine

## 2021-01-26 ENCOUNTER — Ambulatory Visit (HOSPITAL_COMMUNITY)
Admission: EM | Admit: 2021-01-26 | Discharge: 2021-01-26 | Disposition: A | Discharge status: Other Acute Inpatient Hospital | Payer: BC Managed Care – PPO

## 2021-01-26 DIAGNOSIS — J101 Influenza due to other identified influenza virus with other respiratory manifestations: Secondary | ICD-10-CM | POA: Diagnosis not present

## 2021-01-26 DIAGNOSIS — R509 Fever, unspecified: Secondary | ICD-10-CM | POA: Diagnosis not present

## 2021-01-26 DIAGNOSIS — R059 Cough, unspecified: Secondary | ICD-10-CM | POA: Diagnosis not present

## 2021-01-26 DIAGNOSIS — Z20822 Contact with and (suspected) exposure to covid-19: Secondary | ICD-10-CM | POA: Insufficient documentation

## 2021-01-26 LAB — RESP PANEL BY RT-PCR (FLU A&B, COVID) ARPGX2
Influenza A by PCR: POSITIVE — AB
Influenza B by PCR: NEGATIVE
SARS Coronavirus 2 by RT PCR: NEGATIVE

## 2021-01-26 MED ORDER — ACETAMINOPHEN 325 MG PO TABS
650.0000 mg | ORAL_TABLET | Freq: Once | ORAL | Status: AC
  Administered 2021-01-26: 13:00:00 650 mg via ORAL
  Filled 2021-01-26: qty 2

## 2021-01-26 MED ORDER — ONDANSETRON 4 MG PO TBDP: 4.0000 mg | ORAL_TABLET | Freq: Three times a day (TID) | ORAL | 0 refills | Status: DC | PRN

## 2021-01-26 MED ORDER — SODIUM CHLORIDE 0.9 % IV BOLUS
1000.0000 mL | Freq: Once | INTRAVENOUS | Status: AC
  Administered 2021-01-26: 13:00:00 1000 mL via INTRAVENOUS

## 2021-01-26 NOTE — ED Provider Notes (Signed)
Emergency Medicine Provider Triage Evaluation Note  Pamela Guerra , a 25 y.o. female  was evaluated in triage.  Pt complains of body aches x4 days. She has associated fever, chills, nasal congestion, nausea, and vomiting. She hasn't tried any medications for her symptoms. She has tried ibuprofen, tylenol, and nyquil without relief of her symptoms. She denies sick contacts. She denies abdominal pain, chest pain, shortness of breath.  Review of Systems  Positive: Body aches, fever, chills Negative: Chest pain, shortness of breath  Physical Exam  BP (!) 118/103 (BP Location: Left Arm)   Pulse (!) 136   Temp 100 F (37.8 C)   Resp 17   LMP 01/19/2021   SpO2 97%  Gen:   Awake, no distress   Resp:  Normal effort  MSK:   Moves extremities without difficulty    Medical Decision Making  Medically screening exam initiated at 12:30 PM.  Appropriate orders placed.  Pamela Guerra was informed that the remainder of the evaluation will be completed by another provider, this initial triage assessment does not replace that evaluation, and the importance of remaining in the ED until their evaluation is complete.    Kady Toothaker A, PA-C 01/26/21 1235    Gerhard Munch, MD 01/26/21 [phone]

## 2021-01-26 NOTE — ED Provider Notes (Signed)
[facility] EMERGENCY DEPARTMENT Provider Note   CSN: [id number] Arrival date & time: 01/26/21  1135     History No chief complaint on file.   Pamela Guerra is a 25 y.o. female.  25 year old female presents with her mom for evaluation of 4-day duration of fever, chills, myalgias, nausea, vomiting and productive cough.  She denies any known recent sick contacts.  She states she has been unable to take her regular medicines or taking adequate hydration since onset of this illness.  She states she has taken NyQuil, Tylenol without significant relief.  She denies abdominal pain, shortness of breath.  She is tachycardic however both patient and mom state is always tachycardic.  She states she was diagnosed with this when she was young.  She does not currently follow cardiologist for this.  She denies palpitations, lightheadedness, or chest pain.  The history is provided by the patient. No language interpreter was used.      Past Medical History:  Diagnosis Date   Anxiety    Depression    Headache(784.0)    OCD (obsessive compulsive disorder)    Phalanx (hand) fracture 01/2011   right small middle phalanx fx.   PTSD (post-traumatic stress disorder)     Patient Active Problem List   Diagnosis Date Noted   Migraine without aura and without status migrainosus, not intractable 07/09/2014   Tension headache 07/09/2014   Anxiety state 07/09/2014   Acne 06/24/2012    Past Surgical History:  Procedure Laterality Date   FINGER FRACTURE SURGERY       OB History     Gravida  0   Para  0   Term  0   Preterm  0   AB  0   Living  0      SAB  0   IAB  0   Ectopic  0   Multiple  0   Live Births  0           Family History  Problem Relation Age of Onset   Cancer Paternal Grandfather        esophagus   Stroke Maternal Grandfather     Social History   Tobacco Use   Smoking status: Never   Smokeless tobacco: Never  Vaping Use   Vaping  Use: Every day  Substance Use Topics   Alcohol use: Yes    Comment: rare- special events   Drug use: No    Home Medications Prior to Admission medications   Medication Sig Start Date End Date Taking? Authorizing Provider  amphetamine-dextroamphetamine (ADDERALL XR) 10 MG 24 hr capsule Take by mouth. 04/08/20   [provider]  drospirenone-ethinyl estradiol (LORYNA) 3-0.02 MG tablet Take 1 tablet by mouth daily. 07/11/20   Olivia Mackie, NP  loratadine (CLARITIN) 10 MG tablet Take 10 mg by mouth daily. 05/28/20   [provider]  sertraline (ZOLOFT) 100 MG tablet Take 1.5 tablets by mouth at bedtime. 04/08/20   [provider]    Allergies    Other  Review of Systems   Review of Systems  Constitutional:  Positive for chills and fever.  Respiratory:  Positive for cough. Negative for shortness of breath.   Gastrointestinal:  Positive for nausea and vomiting. Negative for abdominal pain.  Neurological:  Negative for light-headedness.  All other systems reviewed and are negative.  Physical Exam Updated Vital Signs BP (!) 118/103 (BP Location: Left Arm)   Pulse (!) 136   Temp  100 F (37.8 C)   Resp 17   LMP 01/19/2021   SpO2 97%   Physical Exam Vitals and nursing note reviewed.  Constitutional:      General: She is not in acute distress.    Appearance: Normal appearance. She is not ill-appearing.  HENT:     Head: Normocephalic and atraumatic.     Nose: Nose normal.  Eyes:     General: No scleral icterus.    Extraocular Movements: Extraocular movements intact.     Conjunctiva/sclera: Conjunctivae normal.  Cardiovascular:     Rate and Rhythm: Normal rate and regular rhythm.     Pulses: Normal pulses.     Heart sounds: Normal heart sounds.  Pulmonary:     Effort: Pulmonary effort is normal. No respiratory distress.     Breath sounds: Normal breath sounds. No wheezing or rales.  Abdominal:     General: There is no distension.     Tenderness:  There is no abdominal tenderness.  Musculoskeletal:        General: Normal range of motion.     Cervical back: Normal range of motion.  Skin:    General: Skin is warm and dry.  Neurological:     General: No focal deficit present.     Mental Status: She is alert. Mental status is at baseline.    ED Results / Procedures / Treatments   Labs (all labs ordered are listed, but only abnormal results are displayed) Labs Reviewed  RESP PANEL BY RT-PCR (FLU A&B, COVID) [id number]    EKG None  Radiology No results found.  Procedures Procedures   Medications Ordered in ED Medications  sodium chloride 0.9 % bolus 1,000 mL (has no administration in time range)  acetaminophen (TYLENOL) tablet 650 mg (650 mg Oral Given 01/26/21 1323)    ED Course  I have reviewed the triage vital signs and the nursing notes.  Pertinent labs & imaging results that were available during my care of the patient were reviewed by me and considered in my medical decision making (see chart for details).    MDM Rules/Calculators/A&P                           47-year-old female presents today for evaluation of 4-day duration of flulike symptoms.  She is tearful, uncomfortable on exam.  She is also tachycardic at about 130.  We will give patient Tylenol, and a liter bolus of fluid and reassess.  We will also get chest x-ray.  She is without respiratory distress.  Patient following IV fluid hydration, Tylenol, Zofran is feeling much better and is ambulating without difficulty.  Patient remains tachycardic. patient is tolerating p.o. fluids without difficulty.  She is without acute distress.  Discussed importance of increased and adequate hydration.  Return precautions discussed with both mom and patient at length.  Symptomatic treatment discussed.  Discussed importance of establishing primary care provider and following up regarding her tachycardia and his overall health.  They both voiced understanding and are in  agreement with plan.  Final Clinical Impression(s) / ED Diagnoses Final diagnoses:  None    Rx / DC Orders ED Discharge Orders     None        Marita Kansas, PA-C 01/26/21 1610    Benjiman Core, MD 01/26/21 Windell Moment

## 2021-01-26 NOTE — ED Triage Notes (Signed)
Pt reports body aches, fever, chills, nausea, vomiting, and congestion x 4 days.

## 2021-01-26 NOTE — Discharge Instructions (Addendum)
You have flu A.  You received IV fluids in the emergency room.  He also received nausea medicine with improvement in your nausea and you are able to drink water.  I recommend you continue taking Tylenol on scheduled every 6 hours for the next couple days to keep your fever under control.  You can take Motrin 400 mg in addition to that if needed for fever or muscle aches.  I have sent in Zofran for you for your nausea.  Make sure you are drinking plenty of fluids.  Your heart rate was elevated in the emergency room.  Even after taking of fluids.  We did discuss this and you state your heart rate was always elevated.  Your breathing is otherwise normal.  Chest x-ray was without pneumonia.  If you have fever is uncontrolled with Tylenol and Motrin, we develop nausea vomiting to the point you are unable to keep any food or drink down please return to the emergency room.  If any help setting up a PCP your discharge paperwork has a phone number you can call and they will assist you.

## 2021-01-28 DIAGNOSIS — F332 Major depressive disorder, recurrent severe without psychotic features: Secondary | ICD-10-CM | POA: Diagnosis not present

## 2021-01-28 DIAGNOSIS — F410 Panic disorder [episodic paroxysmal anxiety] without agoraphobia: Secondary | ICD-10-CM | POA: Diagnosis not present

## 2021-03-04 DIAGNOSIS — F900 Attention-deficit hyperactivity disorder, predominantly inattentive type: Secondary | ICD-10-CM | POA: Diagnosis not present

## 2021-03-04 DIAGNOSIS — F332 Major depressive disorder, recurrent severe without psychotic features: Secondary | ICD-10-CM | POA: Diagnosis not present

## 2021-03-04 DIAGNOSIS — F410 Panic disorder [episodic paroxysmal anxiety] without agoraphobia: Secondary | ICD-10-CM | POA: Diagnosis not present

## 2021-06-11 DIAGNOSIS — F332 Major depressive disorder, recurrent severe without psychotic features: Secondary | ICD-10-CM | POA: Diagnosis not present

## 2021-06-11 DIAGNOSIS — F900 Attention-deficit hyperactivity disorder, predominantly inattentive type: Secondary | ICD-10-CM | POA: Diagnosis not present

## 2021-06-11 DIAGNOSIS — F410 Panic disorder [episodic paroxysmal anxiety] without agoraphobia: Secondary | ICD-10-CM | POA: Diagnosis not present

## 2021-07-07 DIAGNOSIS — J039 Acute tonsillitis, unspecified: Secondary | ICD-10-CM | POA: Diagnosis not present

## 2021-07-07 DIAGNOSIS — J020 Streptococcal pharyngitis: Secondary | ICD-10-CM | POA: Diagnosis not present

## 2021-07-14 ENCOUNTER — Ambulatory Visit: Payer: BC Managed Care – PPO | Admitting: Nurse Practitioner

## 2021-07-14 NOTE — Progress Notes (Unsigned)
   Pamela Guerra 1995-07-02 [id number]   History:  25 y.o. G0 presents for annual exam without GYN complaints. Monthly cycle, OCPs. Has received Gardasil. History of migraines without aura but has not been having them as often. PTSD, anxiety and depression managed by psychiatry - on Zoloft and seeing therapy weekly.   Gynecologic History No LMP recorded.   Contraception/Family planning: OCP (estrogen/progesterone) Sexually active: ***  Health Maintenance Last Pap: 07/11/2020. Results were: Normal Last mammogram: Not indicated Last colonoscopy: Not indicated Last Dexa: Not indicated  Past medical history, past surgical history, family history and social history were all reviewed and documented in the EPIC chart. 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  ROS:  A ROS was performed and pertinent positives and negatives are included.  Exam:  There were no vitals filed for this visit.  There is no height or weight on file to calculate BMI.  General appearance:  Normal Thyroid:  Symmetrical, normal in size, without palpable masses or nodularity. Respiratory  Auscultation:  Clear without wheezing or rhonchi Cardiovascular  Auscultation:  Regular rate, without rubs, murmurs or gallops  Edema/varicosities:  Not grossly evident Abdominal  Soft,nontender, without masses, guarding or rebound.  Liver/spleen:  No organomegaly noted  Hernia:  None appreciated  Skin  Inspection:  Grossly normal Breasts: Not indicated per guidelines Genitourinary   Inguinal/mons:  Normal without inguinal adenopathy  External genitalia:  Normal appearing vulva with no masses, tenderness, or lesions  BUS/Urethra/Skene's glands:  Normal  Vagina:  Normal appearing with normal color and discharge, no lesions  Cervix:  Normal appearing without discharge or lesions  Uterus:  Normal in size, shape and contour.  Midline and mobile, nontender  Adnexa/parametria:     Rt: Normal  in size, without masses or tenderness.   Lt: Normal in size, without masses or tenderness.  Anus and perineum: Normal  Patient informed chaperone available to be present for breast and pelvic exam. Patient has requested no chaperone to be present. Patient has been advised what will be completed during breast and pelvic exam.   Assessment/Plan:  26 y.o. G0 for annual exam.   Well female exam with routine gynecological exam - Education provided on SBEs, importance of preventative screenings, current guidelines, high calcium diet, regular exercise, safe sex, and multivitamin daily.     Screening for cervical cancer - Normal pap history. Will repeat at 3-year interval per guidelines.   Return in 1 year for annual.   Olivia Mackie DNP, 9:44 AM 07/14/2021

## 2021-07-15 ENCOUNTER — Ambulatory Visit: Payer: BC Managed Care – PPO | Admitting: Nurse Practitioner

## 2021-07-15 DIAGNOSIS — Z01419 Encounter for gynecological examination (general) (routine) without abnormal findings: Secondary | ICD-10-CM

## 2021-07-15 DIAGNOSIS — Z0289 Encounter for other administrative examinations: Secondary | ICD-10-CM

## 2021-07-15 DIAGNOSIS — Z3041 Encounter for surveillance of contraceptive pills: Secondary | ICD-10-CM

## 2021-08-06 DIAGNOSIS — H16261 Vernal keratoconjunctivitis, with limbar and corneal involvement, right eye: Secondary | ICD-10-CM | POA: Diagnosis not present

## 2021-10-28 ENCOUNTER — Ambulatory Visit (INDEPENDENT_AMBULATORY_CARE_PROVIDER_SITE_OTHER): Payer: BC Managed Care – PPO | Admitting: Nurse Practitioner

## 2021-10-28 ENCOUNTER — Encounter: Payer: Self-pay | Admitting: Nurse Practitioner

## 2021-10-28 VITALS — BP 122/80 | Ht 65.0 in | Wt 134.0 lb

## 2021-10-28 DIAGNOSIS — Z01419 Encounter for gynecological examination (general) (routine) without abnormal findings: Secondary | ICD-10-CM

## 2021-10-28 DIAGNOSIS — Z113 Encounter for screening for infections with a predominantly sexual mode of transmission: Secondary | ICD-10-CM | POA: Diagnosis not present

## 2021-10-28 DIAGNOSIS — R635 Abnormal weight gain: Secondary | ICD-10-CM

## 2021-10-28 DIAGNOSIS — Z3041 Encounter for surveillance of contraceptive pills: Secondary | ICD-10-CM

## 2021-10-28 MED ORDER — DROSPIRENONE-ETHINYL ESTRADIOL 3-0.02 MG PO TABS: 1.0000 | ORAL_TABLET | Freq: Every day | ORAL | 3 refills | Status: DC

## 2021-10-28 NOTE — Progress Notes (Signed)
   Pamela Guerra [birth year] [id number]   History:  26 y.o. G0 presents for annual exam. Monthly cycle. History of migraines without aura but has not been having them as often. PTSD, anxiety and depression managed by psychiatry - on Zoloft and seeing therapy weekly. Gardasil series completed. 16 pound weight gain since last year.   Gynecologic History Patient's last menstrual period was 10/24/2021. Period Cycle (Days): 28 Period Duration (Days): 5 Period Pattern: Regular Menstrual Flow: Moderate Menstrual Control: Maxi pad, Tampon Dysmenorrhea: (!) Mild Dysmenorrhea Symptoms: Cramping Contraception/Family planning: OCP (estrogen/progesterone) Sexually active: Yes  Health Maintenance Last Pap: 07/11/2020. Results were: Normal Last mammogram: Not indicated Last colonoscopy: Not indicated Last Dexa: Not indicated  Past medical history, past surgical history, family history and social history were all reviewed and documented in the EPIC chart. 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   ROS:  A ROS was performed and pertinent positives and negatives are included.  Exam:  Vitals:   10/28/21 1333  BP: 122/80  Weight: 134 lb (60.8 kg)  Height: 5\' 5"  (1.651 m)    Body mass index is 22.3 kg/m.  General appearance:  Normal Thyroid:  Symmetrical, normal in size, without palpable masses or nodularity. Respiratory  Auscultation:  Clear without wheezing or rhonchi Cardiovascular  Auscultation:  Regular rate, without rubs, murmurs or gallops  Edema/varicosities:  Not grossly evident Abdominal  Soft,nontender, without masses, guarding or rebound.  Liver/spleen:  No organomegaly noted  Hernia:  None appreciated  Skin  Inspection:  Grossly normal Breasts: Examined lying and sitting.   Right: Without masses, retractions, nipple discharge or axillary adenopathy.   Left: Without masses, retractions,  nipple discharge or axillary adenopathy. Genitourinary   Inguinal/mons:  Normal without inguinal adenopathy  External genitalia:  Normal appearing vulva with no masses, tenderness, or lesions  BUS/Urethra/Skene's glands:  Normal  Vagina:  Normal appearing with normal color and discharge, no lesions  Cervix:  Normal appearing without discharge or lesions  Uterus:  Normal in size, shape and contour.  Midline and mobile, nontender  Adnexa/parametria:     Rt: Normal in size, without masses or tenderness.   Lt: Normal in size, without masses or tenderness.  Anus and perineum: Normal  Patient informed chaperone available to be present for breast and pelvic exam. Patient has requested no chaperone to be present. Patient has been advised what will be completed during breast and pelvic exam.   Assessment/Plan:  26 y.o. G0 for annual exam.   Well female exam with routine gynecological exam - Plan: CBC with Differential/Platelet, Comprehensive metabolic panel. Education provided on SBEs, importance of preventative screenings, current guidelines, high calcium diet, regular exercise, and multivitamin daily.   Screen for STD (sexually transmitted disease) - Plan: RPR, HIV Antibody (routine testing w rflx), SURESWAB CT/NG/T. vaginalis  Weight gain - Plan: TSH. Discussed importance of calorie deficit with diet and exercise, intermittent fasting, being conscientious about diet.    Encounter for surveillance of contraceptive pills - Plan: drospirenone-ethinyl estradiol (LORYNA) 3-0.02 MG tablet daily. Taking as prescribed. Refill x 1 year provided.   Screening for cervical cancer - Normal pap history. Will repeat at 3-year interval per guidelines.   Return in 1 year for annual.     03-12-1996 DNP, 2:03 PM 10/28/2021

## 2021-10-29 ENCOUNTER — Encounter: Payer: Self-pay | Admitting: Nurse Practitioner

## 2021-10-29 LAB — CBC WITH DIFFERENTIAL/PLATELET
Absolute Monocytes: 655 cells/uL (ref 200–950)
Basophils Absolute: 59 cells/uL (ref 0–200)
Basophils Relative: 0.7 %
Eosinophils Absolute: 134 cells/uL (ref 15–500)
Eosinophils Relative: 1.6 %
HCT: 37.6 % (ref 35.0–45.0)
Hemoglobin: 12.4 g/dL (ref 11.7–15.5)
Lymphs Abs: 2730 cells/uL (ref 850–3900)
MCH: 28 pg (ref 27.0–33.0)
MCHC: 33 g/dL (ref 32.0–36.0)
MCV: 84.9 fL (ref 80.0–100.0)
MPV: 9 fL (ref 7.5–12.5)
Monocytes Relative: 7.8 %
Neutro Abs: 4822 cells/uL (ref 1500–7800)
Neutrophils Relative %: 57.4 %
Platelets: 431 10*3/uL — ABNORMAL HIGH (ref 140–400)
RBC: 4.43 10*6/uL (ref 3.80–5.10)
RDW: 12.4 % (ref 11.0–15.0)
Total Lymphocyte: 32.5 %
WBC: 8.4 10*3/uL (ref 3.8–10.8)

## 2021-10-29 LAB — SURESWAB CT/NG/T. VAGINALIS
C. trachomatis RNA, TMA: NOT DETECTED
N. gonorrhoeae RNA, TMA: NOT DETECTED
Trichomonas vaginalis RNA: NOT DETECTED

## 2021-10-29 LAB — COMPREHENSIVE METABOLIC PANEL
AG Ratio: 1.2 (calc) (ref 1.0–2.5)
ALT: 10 U/L (ref 6–29)
AST: 14 U/L (ref 10–30)
Albumin: 4.1 g/dL (ref 3.6–5.1)
Alkaline phosphatase (APISO): 66 U/L (ref 31–125)
BUN: 10 mg/dL (ref 7–25)
CO2: 22 mmol/L (ref 20–32)
Calcium: 9.5 mg/dL (ref 8.6–10.2)
Chloride: 104 mmol/L (ref 98–110)
Creat: 0.74 mg/dL (ref 0.50–0.96)
Globulin: 3.4 g/dL (calc) (ref 1.9–3.7)
Glucose, Bld: 81 mg/dL (ref 65–99)
Potassium: 4.4 mmol/L (ref 3.5–5.3)
Sodium: 137 mmol/L (ref 135–146)
Total Bilirubin: 0.3 mg/dL (ref 0.2–1.2)
Total Protein: 7.5 g/dL (ref 6.1–8.1)

## 2021-10-29 LAB — RPR: RPR Ser Ql: NONREACTIVE

## 2021-10-29 LAB — TSH: TSH: 0.65 mIU/L

## 2021-10-29 LAB — HIV ANTIBODY (ROUTINE TESTING W REFLEX): HIV 1&2 Ab, 4th Generation: NONREACTIVE

## 2021-10-30 DIAGNOSIS — F900 Attention-deficit hyperactivity disorder, predominantly inattentive type: Secondary | ICD-10-CM | POA: Diagnosis not present

## 2021-10-30 DIAGNOSIS — F332 Major depressive disorder, recurrent severe without psychotic features: Secondary | ICD-10-CM | POA: Diagnosis not present

## 2021-10-30 DIAGNOSIS — F410 Panic disorder [episodic paroxysmal anxiety] without agoraphobia: Secondary | ICD-10-CM | POA: Diagnosis not present

## 2022-02-06 DIAGNOSIS — R509 Fever, unspecified: Secondary | ICD-10-CM | POA: Diagnosis not present

## 2022-02-06 DIAGNOSIS — B349 Viral infection, unspecified: Secondary | ICD-10-CM | POA: Diagnosis not present

## 2022-03-19 DIAGNOSIS — F332 Major depressive disorder, recurrent severe without psychotic features: Secondary | ICD-10-CM | POA: Diagnosis not present

## 2022-03-19 DIAGNOSIS — F900 Attention-deficit hyperactivity disorder, predominantly inattentive type: Secondary | ICD-10-CM | POA: Diagnosis not present

## 2022-03-19 DIAGNOSIS — F410 Panic disorder [episodic paroxysmal anxiety] without agoraphobia: Secondary | ICD-10-CM | POA: Diagnosis not present

## 2022-06-24 DIAGNOSIS — F900 Attention-deficit hyperactivity disorder, predominantly inattentive type: Secondary | ICD-10-CM | POA: Diagnosis not present

## 2022-06-24 DIAGNOSIS — F332 Major depressive disorder, recurrent severe without psychotic features: Secondary | ICD-10-CM | POA: Diagnosis not present

## 2022-06-24 DIAGNOSIS — F410 Panic disorder [episodic paroxysmal anxiety] without agoraphobia: Secondary | ICD-10-CM | POA: Diagnosis not present

## 2022-09-30 DIAGNOSIS — F410 Panic disorder [episodic paroxysmal anxiety] without agoraphobia: Secondary | ICD-10-CM | POA: Diagnosis not present

## 2022-09-30 DIAGNOSIS — F332 Major depressive disorder, recurrent severe without psychotic features: Secondary | ICD-10-CM | POA: Diagnosis not present

## 2022-09-30 DIAGNOSIS — F900 Attention-deficit hyperactivity disorder, predominantly inattentive type: Secondary | ICD-10-CM | POA: Diagnosis not present

## 2022-12-15 DIAGNOSIS — R5382 Chronic fatigue, unspecified: Secondary | ICD-10-CM | POA: Diagnosis not present

## 2022-12-15 DIAGNOSIS — E559 Vitamin D deficiency, unspecified: Secondary | ICD-10-CM | POA: Diagnosis not present

## 2022-12-15 DIAGNOSIS — F411 Generalized anxiety disorder: Secondary | ICD-10-CM | POA: Diagnosis not present

## 2022-12-15 DIAGNOSIS — Z8639 Personal history of other endocrine, nutritional and metabolic disease: Secondary | ICD-10-CM | POA: Diagnosis not present

## 2022-12-15 DIAGNOSIS — E538 Deficiency of other specified B group vitamins: Secondary | ICD-10-CM | POA: Diagnosis not present

## 2022-12-23 DIAGNOSIS — F900 Attention-deficit hyperactivity disorder, predominantly inattentive type: Secondary | ICD-10-CM | POA: Diagnosis not present

## 2022-12-23 DIAGNOSIS — F410 Panic disorder [episodic paroxysmal anxiety] without agoraphobia: Secondary | ICD-10-CM | POA: Diagnosis not present

## 2022-12-23 DIAGNOSIS — F332 Major depressive disorder, recurrent severe without psychotic features: Secondary | ICD-10-CM | POA: Diagnosis not present

## 2023-01-22 DIAGNOSIS — F900 Attention-deficit hyperactivity disorder, predominantly inattentive type: Secondary | ICD-10-CM | POA: Diagnosis not present

## 2023-01-22 DIAGNOSIS — F332 Major depressive disorder, recurrent severe without psychotic features: Secondary | ICD-10-CM | POA: Diagnosis not present

## 2023-01-22 DIAGNOSIS — F410 Panic disorder [episodic paroxysmal anxiety] without agoraphobia: Secondary | ICD-10-CM | POA: Diagnosis not present

## 2023-03-08 ENCOUNTER — Encounter: Payer: Self-pay | Admitting: Nurse Practitioner

## 2023-03-08 ENCOUNTER — Ambulatory Visit (INDEPENDENT_AMBULATORY_CARE_PROVIDER_SITE_OTHER): Payer: BC Managed Care – PPO | Admitting: Nurse Practitioner

## 2023-03-08 ENCOUNTER — Other Ambulatory Visit (HOSPITAL_COMMUNITY)
Admission: RE | Admit: 2023-03-08 | Discharge: 2023-03-08 | Disposition: A | Discharge status: Home / Self Care | Payer: BC Managed Care – PPO | Source: Ambulatory Visit | Attending: Nurse Practitioner | Admitting: Nurse Practitioner

## 2023-03-08 VITALS — BP 118/64 | HR 95 | Ht 65.0 in | Wt 141.0 lb

## 2023-03-08 DIAGNOSIS — Z113 Encounter for screening for infections with a predominantly sexual mode of transmission: Secondary | ICD-10-CM

## 2023-03-08 DIAGNOSIS — Z3041 Encounter for surveillance of contraceptive pills: Secondary | ICD-10-CM

## 2023-03-08 DIAGNOSIS — Z01419 Encounter for gynecological examination (general) (routine) without abnormal findings: Secondary | ICD-10-CM | POA: Diagnosis not present

## 2023-03-08 DIAGNOSIS — Z124 Encounter for screening for malignant neoplasm of cervix: Secondary | ICD-10-CM

## 2023-03-08 MED ORDER — DROSPIRENONE-ETHINYL ESTRADIOL 3-0.02 MG PO TABS: 1.0000 | ORAL_TABLET | Freq: Every day | ORAL | 3 refills | Status: AC

## 2023-03-08 NOTE — Progress Notes (Signed)
 Pamela Guerra 13-Mar-1995 [id number]   History:  28 y.o. G0 presents for annual exam. Monthly cycles. Stopped her birth control for about 5 months last year and did not do well emotionally, so she restarted. H/O migraines without aura. PTSD, anxiety and depression managed by psychiatry - on Zoloft and seeing therapy weekly. Gardasil series completed.   Gynecologic History Patient's last menstrual period was 03/01/2023 (approximate). Period Duration (Days): 3-6 Period Pattern: Regular Menstrual Flow: Moderate Menstrual Control: Tampon Dysmenorrhea: (!) Mild Dysmenorrhea Symptoms: Cramping, Headache Contraception/Family planning: OCP (estrogen/progesterone) Sexually active: Yes, STD screening requested  Health Maintenance Last Pap: 07/11/2020. Results were: Normal Last mammogram: Not indicated Last colonoscopy: Not indicated Last Dexa: Not indicated  Past medical history, past surgical history, family history and social history were all reviewed and documented in the EPIC chart. 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   ROS:  A ROS was performed and pertinent positives and negatives are included.  Exam:  Vitals:   03/08/23 0944  BP: 118/64  Pulse: 95  SpO2: 99%  Weight: 141 lb (64 kg)  Height: 5' 5 (1.651 m)     Body mass index is 23.46 kg/m.  General appearance:  Normal Thyroid:  Symmetrical, normal in size, without palpable masses or nodularity. Respiratory  Auscultation:  Clear without wheezing or rhonchi Cardiovascular  Auscultation:  Regular rate, without rubs, murmurs or gallops  Edema/varicosities:  Not grossly evident Abdominal  Soft,nontender, without masses, guarding or rebound.  Liver/spleen:  No organomegaly noted  Hernia:  None appreciated  Skin  Inspection:  Grossly normal Breasts: Examined lying and sitting.   Right: Without masses, retractions, nipple discharge or axillary adenopathy.   Left: Without masses, retractions, nipple  discharge or axillary adenopathy. Pelvic: External genitalia:  no lesions              Urethra:  normal appearing urethra with no masses, tenderness or lesions              Bartholins and Skenes: normal                 Vagina: normal appearing vagina with normal color and discharge, no lesions              Cervix: no lesions Bimanual Exam:  Uterus:  no masses or tenderness              Adnexa: no mass, fullness, tenderness              Rectovaginal: Deferred              Anus:  normal, no lesions  Patient informed chaperone available to be present for breast and pelvic exam. Patient has requested no chaperone to be present. Patient has been advised what will be completed during breast and pelvic exam.   Assessment/Plan:  28 y.o. G0 for annual exam.   Well female exam with routine gynecological exam - Education provided on SBEs, importance of preventative screenings, current guidelines, high calcium diet, regular exercise, and multivitamin daily. Labs with PCP.   Cervical cancer screening - Plan: Cytology - PAP( [facility]). Normal pap history.   Screening examination for STD (sexually transmitted disease) - Plan: Cytology - PAP( [facility]). GC/CT/trich added to pap. Plans to ask PCP to add HIV/RPR during next lab draw.   Encounter for surveillance of contraceptive pills - Plan: drospirenone -ethinyl estradiol  (LORYNA ) 3-0.02 MG tablet daily. Taking as prescribed. Refill x 1 year provided.   Return in about 1  year (around 03/07/2024) for Annual.     Pamela DELENA Shutter DNP, 10:48 AM 03/08/2023

## 2023-03-10 ENCOUNTER — Other Ambulatory Visit: Payer: Self-pay | Admitting: Nurse Practitioner

## 2023-03-10 DIAGNOSIS — B9689 Other specified bacterial agents as the cause of diseases classified elsewhere: Secondary | ICD-10-CM

## 2023-03-10 LAB — CYTOLOGY - PAP
Chlamydia: NEGATIVE
Comment: NEGATIVE
Comment: NEGATIVE
Comment: NORMAL
Diagnosis: NEGATIVE
Diagnosis: REACTIVE
Neisseria Gonorrhea: NEGATIVE
Trichomonas: NEGATIVE

## 2023-03-10 MED ORDER — METRONIDAZOLE 500 MG PO TABS: 500.0000 mg | ORAL_TABLET | Freq: Two times a day (BID) | ORAL | 0 refills | Status: AC

## 2023-10-02 IMAGING — CR DG CHEST 2V
2 series · 2 of 2 positions shown · non-contrast
Comparison: None.

CLINICAL DATA: Fever and cough

EXAM:
CHEST - 2 VIEW

[chest pa]
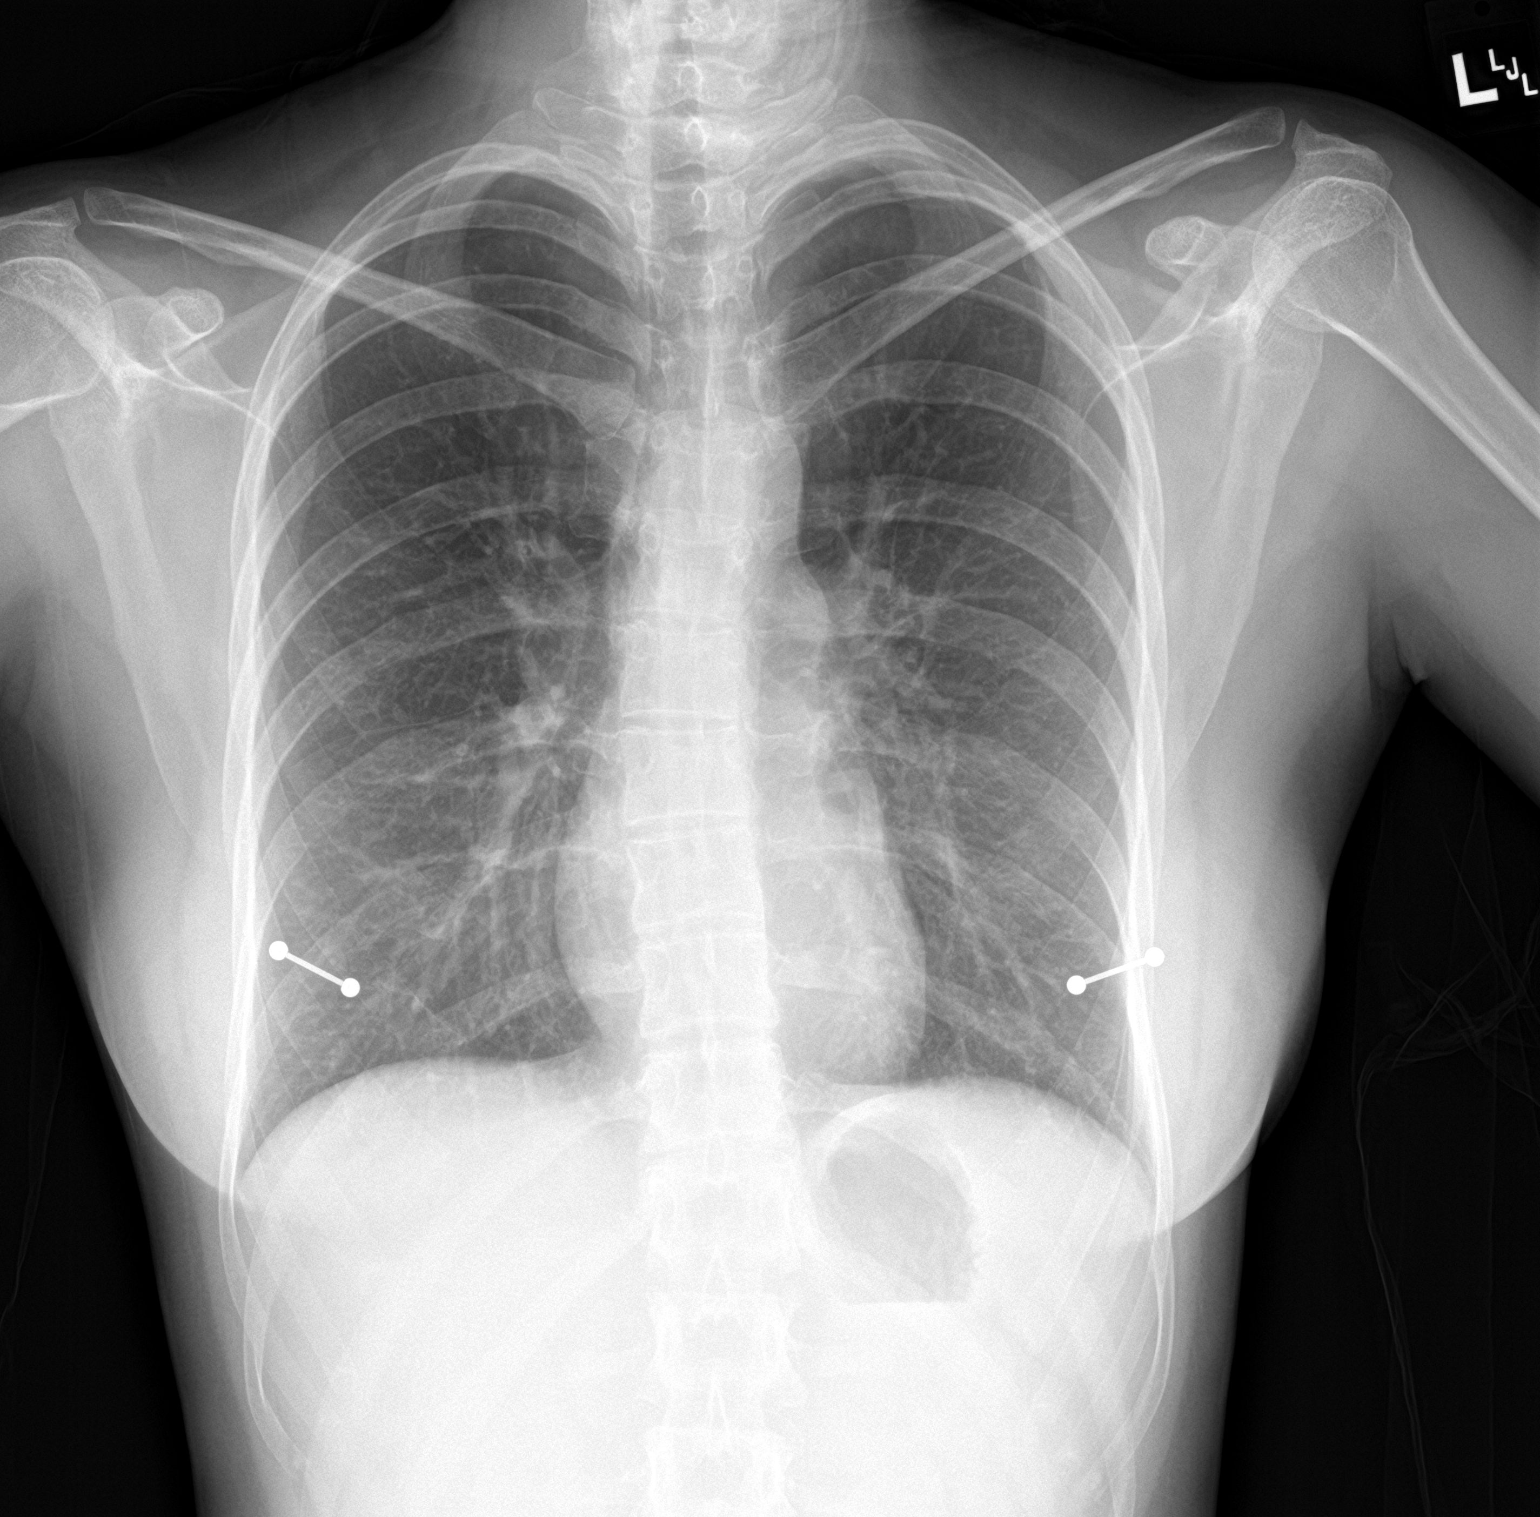

[chest lat]
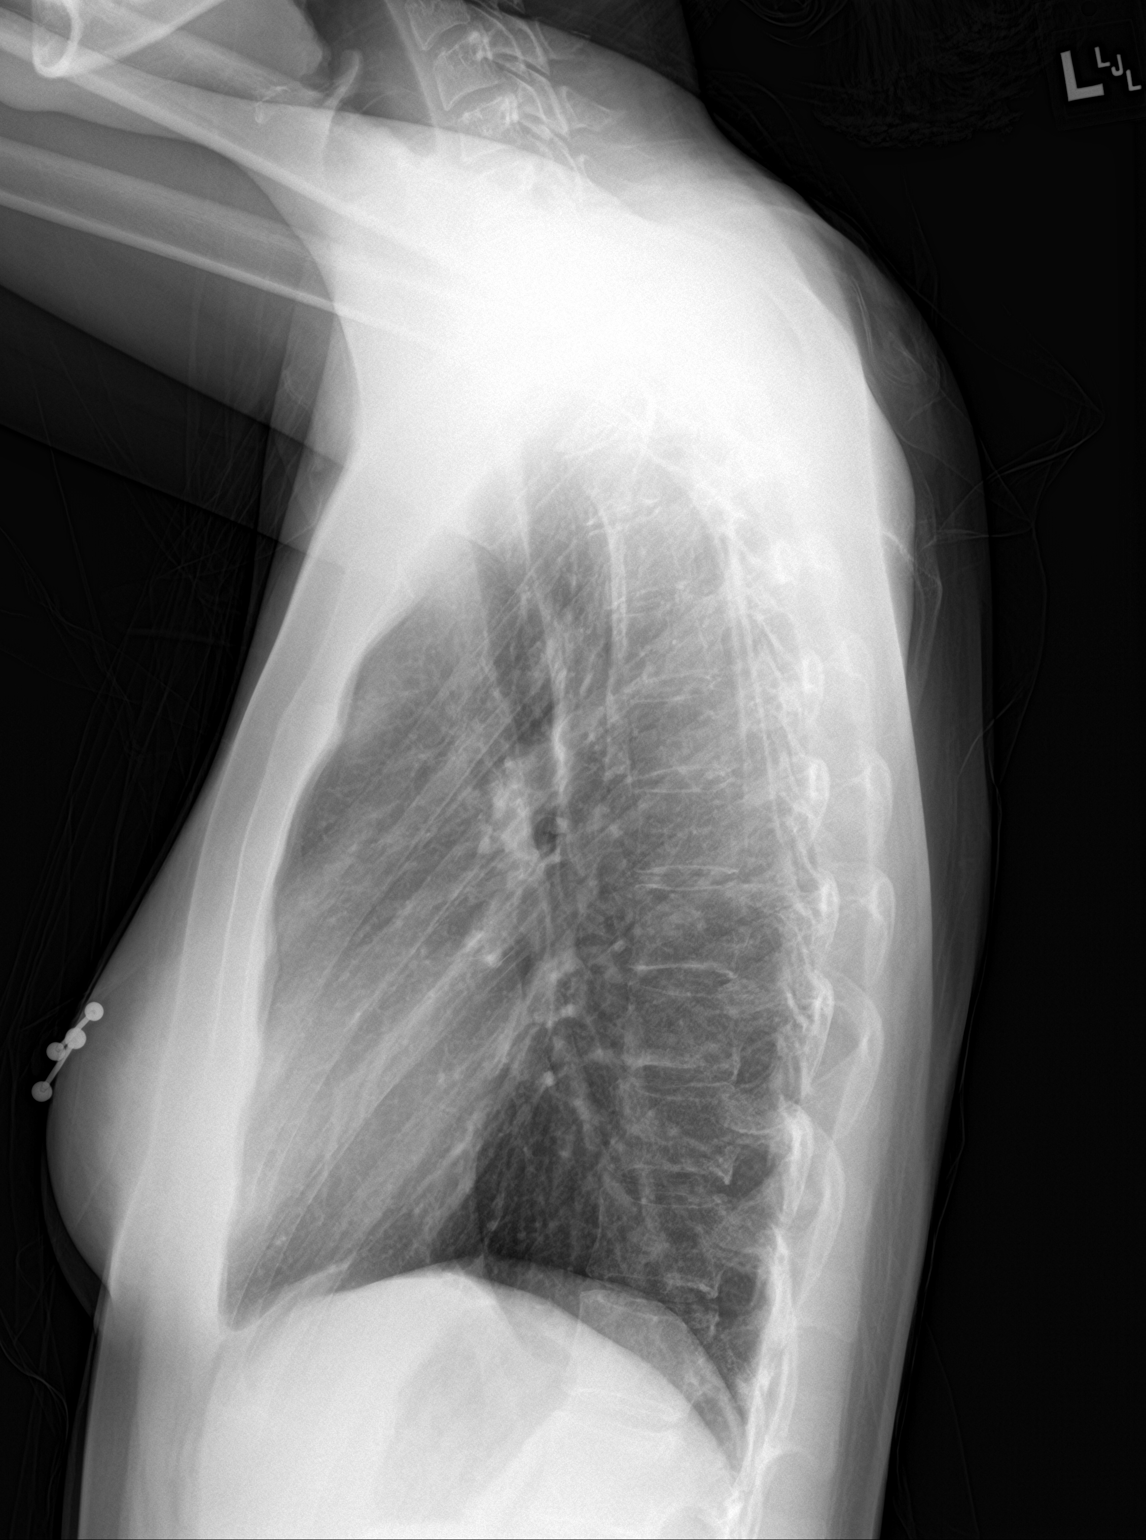

[2 of 2 positions shown; findings below may reference images not displayed]

FINDINGS: The heart size and mediastinal contours are within normal limits.
Both lungs are clear. The visualized skeletal structures are
unremarkable.
IMPRESSION: No active cardiopulmonary disease.
